# Patient Record
Sex: Male | Born: 1990 | Race: Black or African American | Hispanic: No | Marital: Single | Smoking: Current some day smoker
Health system: Southern US, Community
[De-identification: ages and names within clinical notes are randomized; demographics above are authoritative.]

## PROBLEM LIST (undated history)

## (undated) DIAGNOSIS — R011 Cardiac murmur, unspecified: Secondary | ICD-10-CM

---
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
## 2011-07-21 ENCOUNTER — Inpatient Hospital Stay (INDEPENDENT_AMBULATORY_CARE_PROVIDER_SITE_OTHER)
Admission: RE | Admit: 2011-07-21 | Discharge: 2011-07-21 | Disposition: A | Discharge status: Home / Self Care | Payer: Self-pay | Source: Ambulatory Visit | Attending: Emergency Medicine | Admitting: Emergency Medicine

## 2011-07-21 DIAGNOSIS — S0180XA Unspecified open wound of other part of head, initial encounter: Secondary | ICD-10-CM

## 2016-08-10 ENCOUNTER — Emergency Department (HOSPITAL_COMMUNITY): Payer: No Typology Code available for payment source

## 2016-08-10 ENCOUNTER — Encounter (HOSPITAL_COMMUNITY): Payer: Self-pay | Admitting: Emergency Medicine

## 2016-08-10 ENCOUNTER — Emergency Department (HOSPITAL_COMMUNITY)
Admission: EM | Admit: 2016-08-10 | Discharge: 2016-08-10 | Disposition: A | Discharge status: Home / Self Care | Payer: No Typology Code available for payment source | Attending: Emergency Medicine | Admitting: Emergency Medicine

## 2016-08-10 DIAGNOSIS — S199XXA Unspecified injury of neck, initial encounter: Secondary | ICD-10-CM | POA: Diagnosis present

## 2016-08-10 DIAGNOSIS — S8992XA Unspecified injury of left lower leg, initial encounter: Secondary | ICD-10-CM | POA: Diagnosis not present

## 2016-08-10 DIAGNOSIS — S59902A Unspecified injury of left elbow, initial encounter: Secondary | ICD-10-CM | POA: Diagnosis not present

## 2016-08-10 DIAGNOSIS — S161XXA Strain of muscle, fascia and tendon at neck level, initial encounter: Secondary | ICD-10-CM | POA: Diagnosis not present

## 2016-08-10 DIAGNOSIS — Y939 Activity, unspecified: Secondary | ICD-10-CM | POA: Insufficient documentation

## 2016-08-10 DIAGNOSIS — S0990XA Unspecified injury of head, initial encounter: Secondary | ICD-10-CM | POA: Diagnosis not present

## 2016-08-10 DIAGNOSIS — Y999 Unspecified external cause status: Secondary | ICD-10-CM | POA: Diagnosis not present

## 2016-08-10 DIAGNOSIS — S20211A Contusion of right front wall of thorax, initial encounter: Secondary | ICD-10-CM | POA: Diagnosis not present

## 2016-08-10 DIAGNOSIS — F17200 Nicotine dependence, unspecified, uncomplicated: Secondary | ICD-10-CM | POA: Insufficient documentation

## 2016-08-10 DIAGNOSIS — Y92410 Unspecified street and highway as the place of occurrence of the external cause: Secondary | ICD-10-CM | POA: Diagnosis not present

## 2016-08-10 MED ORDER — IBUPROFEN 800 MG PO TABS: 800.0000 mg | ORAL_TABLET | Freq: Three times a day (TID) | ORAL | 0 refills | Status: DC | PRN

## 2016-08-10 MED ORDER — TRAMADOL HCL 50 MG PO TABS
50.0000 mg | ORAL_TABLET | Freq: Four times a day (QID) | ORAL | 0 refills | Status: DC | PRN
Start: 2016-08-10 — End: 2017-07-10

## 2016-08-10 NOTE — ED Notes (Signed)
Declined W/C at D/C and was escorted to lobby by RN. 
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
## 2016-08-10 NOTE — Discharge Instructions (Signed)
Return here as needed.  Follow-up with a primary care doctor or urgent care

## 2017-07-10 ENCOUNTER — Emergency Department (HOSPITAL_COMMUNITY): Payer: Self-pay

## 2017-07-10 ENCOUNTER — Encounter (HOSPITAL_COMMUNITY): Payer: Self-pay | Admitting: Emergency Medicine

## 2017-07-10 ENCOUNTER — Emergency Department (HOSPITAL_COMMUNITY)
Admission: EM | Admit: 2017-07-10 | Discharge: 2017-07-10 | Disposition: A | Discharge status: Home / Self Care | Payer: Self-pay | Attending: Emergency Medicine | Admitting: Emergency Medicine

## 2017-07-10 DIAGNOSIS — S4992XA Unspecified injury of left shoulder and upper arm, initial encounter: Secondary | ICD-10-CM

## 2017-07-10 DIAGNOSIS — S62314A Displaced fracture of base of fourth metacarpal bone, right hand, initial encounter for closed fracture: Secondary | ICD-10-CM | POA: Insufficient documentation

## 2017-07-10 DIAGNOSIS — Z79899 Other long term (current) drug therapy: Secondary | ICD-10-CM | POA: Insufficient documentation

## 2017-07-10 DIAGNOSIS — Y929 Unspecified place or not applicable: Secondary | ICD-10-CM | POA: Insufficient documentation

## 2017-07-10 DIAGNOSIS — W108XXA Fall (on) (from) other stairs and steps, initial encounter: Secondary | ICD-10-CM | POA: Insufficient documentation

## 2017-07-10 DIAGNOSIS — F17200 Nicotine dependence, unspecified, uncomplicated: Secondary | ICD-10-CM | POA: Insufficient documentation

## 2017-07-10 DIAGNOSIS — S4352XA Sprain of left acromioclavicular joint, initial encounter: Secondary | ICD-10-CM | POA: Insufficient documentation

## 2017-07-10 DIAGNOSIS — Y999 Unspecified external cause status: Secondary | ICD-10-CM | POA: Insufficient documentation

## 2017-07-10 DIAGNOSIS — S62312A Displaced fracture of base of third metacarpal bone, right hand, initial encounter for closed fracture: Secondary | ICD-10-CM | POA: Insufficient documentation

## 2017-07-10 DIAGNOSIS — Y939 Activity, unspecified: Secondary | ICD-10-CM | POA: Insufficient documentation

## 2017-07-10 MED ORDER — HYDROCODONE-ACETAMINOPHEN 5-325 MG PO TABS
1.0000 | ORAL_TABLET | Freq: Once | ORAL | Status: AC
  Administered 2017-07-10: 1 via ORAL
  Filled 2017-07-10: qty 1

## 2017-07-10 MED ORDER — HYDROCODONE-ACETAMINOPHEN 5-325 MG PO TABS: 1.0000 | ORAL_TABLET | Freq: Four times a day (QID) | ORAL | 0 refills | Status: DC | PRN

## 2017-07-10 MED ORDER — NAPROXEN 375 MG PO TABS: ORAL_TABLET | ORAL | 0 refills | Status: DC

## 2017-07-10 NOTE — ED Provider Notes (Addendum)
[facility]-EMERGENCY DEPT Provider Note: Gary Dell, MD, FACEP  CSN: [id number] MRN: [id number] ARRIVAL: 07/10/17 at 0213 ROOM: WA21/[facility]   CHIEF COMPLAINT  Fall   HISTORY OF PRESENT ILLNESS  07/10/17 3:28 AM Gary Owen is a 26 y.o. male who fell down about 11 stairs sometime earlier this morning. He estimates it was about midnight. He is complaining of pain in his right hand with associated swelling dorsally. He rates his pain presently a 5 out of 10, worse with palpation or movement. Range of motion of the right third, fourth and fifth fingers of the right hand is limited due to pain and swelling. There is no sensory or functional deficit distal to the injury. He was given of IV fentanyl by EMS prior to arrival with partial relief. He is also having pain and swelling over his left acromioclavicular joint. He denies neck pain, back pain or other injury.  Consultation with the [facility] state controlled substances database reveals the patient has received one prescription for tramadol in October of last year.   History reviewed. No pertinent past medical history.  History reviewed. No pertinent surgical history.  No family history on file.  Social History  Substance Use Topics  . Smoking status: Current Every Day Smoker  . Smokeless tobacco: Current User  . Alcohol use Yes    Prior to Admission medications   Medication Sig Start Date End Date Taking? Authorizing Provider  ibuprofen (ADVIL,MOTRIN) 800 MG tablet Take 1 tablet (800 mg total) by mouth every 8 (eight) hours as needed. 08/10/16   Lawyer, Cristal Deer, PA-C  traMADol (ULTRAM) 50 MG tablet Take 1 tablet (50 mg total) by mouth every 6 (six) hours as needed for severe pain. 08/10/16   Charlestine Night, PA-C    Allergies Patient has no known allergies.   REVIEW OF SYSTEMS  Negative except as noted here or in the History of Present Illness.   PHYSICAL EXAMINATION  Initial Vital Signs Blood pressure  (!) 149/106, pulse 64, temperature 97.8 F (36.6 C), temperature source Oral, resp. rate 16, height  (1.88 m), weight 93.4 kg (206 lb), SpO2 95 %.  Examination General: Well-developed, well-nourished male in no acute distress; appearance consistent with age of record HENT: normocephalic; atraumatic Eyes: pupils equal, round and reactive to light; extraocular muscles intact Neck: supple; no C-spine tenderness Heart: regular rate and rhythm Lungs: clear to auscultation bilaterally Chest: Nontender Abdomen: soft; nondistended; nontender Back: No spinal tenderness Extremities: Pulses normal; tenderness and swelling of dorsal right hand, fingers distally neurovascularly intact with intact tendon function but range of motion limited due to pain and swelling; tenderness and swelling over left acromioclavicular joint with superficial abrasion, no joint laxity Neurologic: Awake, alert and oriented; motor function intact in all extremities and symmetric; no facial droop Skin: Warm and dry Psychiatric: Normal mood and affect   RESULTS  Summary of this visit's results, reviewed by myself:   EKG Interpretation  Date/Time:    Ventricular Rate:    PR Interval:    QRS Duration:   QT Interval:    QTC Calculation:   R Axis:     Text Interpretation:        Laboratory Studies: No results found for this or any previous visit (from the past 24 hour(s)). Imaging Studies: Dg Shoulder Left  Result Date: 07/10/2017 CLINICAL DATA:  Patient fell down 11 steps. Pain and abrasions to the anterior left shoulder. EXAM: LEFT SHOULDER - 2+ VIEW COMPARISON:  None. FINDINGS:  There is no evidence of fracture or dislocation. There is no evidence of arthropathy or other focal bone abnormality. Soft tissues are unremarkable. IMPRESSION: Negative. Electronically Signed   By: Burman Nieves M.D.   On: 07/10/2017 04:13   Dg Hand Complete Right  Result Date: 07/10/2017 CLINICAL DATA:  Fall EXAM: RIGHT HAND -  COMPLETE 3+ VIEW COMPARISON:  None. FINDINGS: There are medially displaced fractures of the right third and fourth metacarpals near their bases. The articular surfaces are incompletely visualized. There is suspected intra-articular extension of the third metacarpal fracture. I do not see intra-articular extension of the fourth metacarpal fracture. No other fracture of the right hand. IMPRESSION: Medially displaced fractures of the proximal right third and fourth metacarpals. Suspected i intra-articular extension of the third metacarpal fracture. If more precise determination of intra-articular involvement is needed for clinical decision making, CT would be helpful. Electronically Signed   By: Deatra Robinson M.D.   On: 07/10/2017 03:02    ED COURSE  Nursing notes and initial vitals signs, including pulse oximetry, reviewed.  Vitals:   07/10/17 0229 07/10/17 0459  BP: (!) 149/106 (!) 144/86  Pulse: 64 60  Resp: 16 20  Temp: 97.8 F (36.6 C) 97.8 F (36.6 C)  TempSrc: Oral Oral  SpO2: 95% 99%  Weight: 93.4 kg (206 lb)   Height:  (1.88 m)    4:30 AM Discussed with Dr. Melvyn Novas of hand surgery. He recommends we splint the patient and he will see him in the office.  5:18 AM Volar splint applied by orthopedic technician. Fingers remain neurovascularly intact.  PROCEDURES    ED DIAGNOSES     ICD-10-CM   1. Fall down stairs, initial encounter W10.8XXA   2. Closed displaced fracture of base of third metacarpal bone of right hand, initial encounter S62.312A   3. Closed displaced fracture of base of fourth metacarpal bone of right hand, initial encounter S62.314A   4. Acromioclavicular (AC) joint injury, left, initial encounter S49.92XA        [name], [name], MD 07/10/17 9147    [name], MD 07/10/17 Jeralyn Bennett

## 2017-07-10 NOTE — ED Notes (Signed)
Pt. Given ice pack. 

## 2017-07-10 NOTE — ED Notes (Signed)
Pt. Documented in error remove jewelry completed.
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
## 2018-02-10 ENCOUNTER — Inpatient Hospital Stay (HOSPITAL_COMMUNITY)
Admission: EM | Admit: 2018-02-10 | Discharge: 2018-02-11 | DRG: 580 | Disposition: A | Discharge status: Home / Self Care | Payer: Self-pay | Attending: Internal Medicine | Admitting: Internal Medicine

## 2018-02-10 ENCOUNTER — Encounter (HOSPITAL_COMMUNITY): Payer: Self-pay | Admitting: *Deleted

## 2018-02-10 DIAGNOSIS — L0390 Cellulitis, unspecified: Secondary | ICD-10-CM | POA: Diagnosis present

## 2018-02-10 DIAGNOSIS — D696 Thrombocytopenia, unspecified: Secondary | ICD-10-CM | POA: Diagnosis present

## 2018-02-10 DIAGNOSIS — I441 Atrioventricular block, second degree: Secondary | ICD-10-CM | POA: Diagnosis present

## 2018-02-10 DIAGNOSIS — W540XXA Bitten by dog, initial encounter: Secondary | ICD-10-CM

## 2018-02-10 DIAGNOSIS — F17200 Nicotine dependence, unspecified, uncomplicated: Secondary | ICD-10-CM | POA: Diagnosis present

## 2018-02-10 DIAGNOSIS — Z79891 Long term (current) use of opiate analgesic: Secondary | ICD-10-CM

## 2018-02-10 DIAGNOSIS — F17290 Nicotine dependence, other tobacco product, uncomplicated: Secondary | ICD-10-CM

## 2018-02-10 DIAGNOSIS — Z23 Encounter for immunization: Secondary | ICD-10-CM

## 2018-02-10 DIAGNOSIS — S60572A Other superficial bite of hand of left hand, initial encounter: Secondary | ICD-10-CM | POA: Diagnosis present

## 2018-02-10 DIAGNOSIS — Z791 Long term (current) use of non-steroidal anti-inflammatories (NSAID): Secondary | ICD-10-CM

## 2018-02-10 DIAGNOSIS — R001 Bradycardia, unspecified: Secondary | ICD-10-CM | POA: Diagnosis present

## 2018-02-10 DIAGNOSIS — L03114 Cellulitis of left upper limb: Principal | ICD-10-CM | POA: Diagnosis present

## 2018-02-10 DIAGNOSIS — S61452A Open bite of left hand, initial encounter: Secondary | ICD-10-CM

## 2018-02-10 DIAGNOSIS — B9561 Methicillin susceptible Staphylococcus aureus infection as the cause of diseases classified elsewhere: Secondary | ICD-10-CM | POA: Diagnosis present

## 2018-02-10 DIAGNOSIS — L02512 Cutaneous abscess of left hand: Secondary | ICD-10-CM | POA: Diagnosis present

## 2018-02-10 LAB — CBC WITH DIFFERENTIAL/PLATELET
BASOS ABS: 0 10*3/uL (ref 0.0–0.1)
BASOS PCT: 0 %
EOS PCT: 1 %
Eosinophils Absolute: 0.1 10*3/uL (ref 0.0–0.7)
HCT: 42.2 % (ref 39.0–52.0)
Hemoglobin: 14.5 g/dL (ref 13.0–17.0)
LYMPHS PCT: 24 %
Lymphs Abs: 2.4 10*3/uL (ref 0.7–4.0)
MCH: 27.4 pg (ref 26.0–34.0)
MCHC: 34.4 g/dL (ref 30.0–36.0)
MCV: 79.6 fL (ref 78.0–100.0)
Monocytes Absolute: 0.7 10*3/uL (ref 0.1–1.0)
Monocytes Relative: 7 %
Neutro Abs: 6.6 10*3/uL (ref 1.7–7.7)
Neutrophils Relative %: 68 %
PLATELETS: 120 10*3/uL — AB (ref 150–400)
RBC: 5.3 MIL/uL (ref 4.22–5.81)
RDW: 15.1 % (ref 11.5–15.5)
WBC: 9.8 10*3/uL (ref 4.0–10.5)

## 2018-02-10 LAB — BASIC METABOLIC PANEL
ANION GAP: 11 (ref 5–15)
BUN: 9 mg/dL (ref 6–20)
CO2: 23 mmol/L (ref 22–32)
Calcium: 9.7 mg/dL (ref 8.9–10.3)
Chloride: 105 mmol/L (ref 101–111)
Creatinine, Ser: 1.08 mg/dL (ref 0.61–1.24)
GLUCOSE: 101 mg/dL — AB (ref 65–99)
Potassium: 3.9 mmol/L (ref 3.5–5.1)
Sodium: 139 mmol/L (ref 135–145)

## 2018-02-10 LAB — SEDIMENTATION RATE: Sed Rate: 3 mm/hr (ref 0–16)

## 2018-02-10 MED ORDER — HYDROCODONE-ACETAMINOPHEN 5-325 MG PO TABS
1.0000 | ORAL_TABLET | Freq: Once | ORAL | Status: AC
  Administered 2018-02-10: 1 via ORAL
  Filled 2018-02-10: qty 1

## 2018-02-10 MED ORDER — SODIUM CHLORIDE 0.9 % IV SOLN
3.0000 g | Freq: Once | INTRAVENOUS | Status: AC
  Administered 2018-02-10: 3 g via INTRAVENOUS
  Filled 2018-02-10: qty 3

## 2018-02-10 MED ORDER — VITAMIN C 500 MG PO TABS
1000.0000 mg | ORAL_TABLET | Freq: Every day | ORAL | Status: DC
  Administered 2018-02-10 – 2018-02-11 (×2): 1000 mg via ORAL
  Filled 2018-02-10 (×2): qty 2

## 2018-02-10 MED ORDER — TETANUS-DIPHTH-ACELL PERTUSSIS 5-2.5-18.5 LF-MCG/0.5 IM SUSP
0.5000 mL | Freq: Once | INTRAMUSCULAR | Status: AC
  Administered 2018-02-10: 0.5 mL via INTRAMUSCULAR
  Filled 2018-02-10: qty 0.5

## 2018-02-10 MED ORDER — ENOXAPARIN SODIUM 40 MG/0.4ML ~~LOC~~ SOLN
40.0000 mg | SUBCUTANEOUS | Status: DC
  Administered 2018-02-11: 40 mg via SUBCUTANEOUS
  Filled 2018-02-10 (×2): qty 0.4

## 2018-02-10 MED ORDER — SENNOSIDES-DOCUSATE SODIUM 8.6-50 MG PO TABS: 1.0000 | ORAL_TABLET | Freq: Every evening | ORAL | Status: DC | PRN

## 2018-02-10 MED ORDER — FAMOTIDINE 20 MG PO TABS
20.0000 mg | ORAL_TABLET | Freq: Every day | ORAL | Status: DC
  Administered 2018-02-10 – 2018-02-11 (×2): 20 mg via ORAL
  Filled 2018-02-10 (×2): qty 1

## 2018-02-10 MED ORDER — ACETAMINOPHEN 325 MG PO TABS: 650.0000 mg | ORAL_TABLET | Freq: Four times a day (QID) | ORAL | Status: DC | PRN

## 2018-02-10 MED ORDER — METHOCARBAMOL 500 MG PO TABS
500.0000 mg | ORAL_TABLET | Freq: Four times a day (QID) | ORAL | Status: DC | PRN
  Administered 2018-02-11: 500 mg via ORAL
  Filled 2018-02-10: qty 1

## 2018-02-10 MED ORDER — MORPHINE SULFATE (PF) 4 MG/ML IV SOLN: 2.0000 mg | INTRAVENOUS | Status: DC | PRN

## 2018-02-10 MED ORDER — HYDROMORPHONE HCL 2 MG/ML IJ SOLN
0.5000 mg | Freq: Once | INTRAMUSCULAR | Status: AC
  Administered 2018-02-10: 0.5 mg via INTRAVENOUS
  Filled 2018-02-10: qty 1

## 2018-02-10 MED ORDER — SODIUM CHLORIDE 0.9 % IV SOLN
3.0000 g | Freq: Four times a day (QID) | INTRAVENOUS | Status: DC
  Administered 2018-02-10 – 2018-02-11 (×3): 3 g via INTRAVENOUS
  Filled 2018-02-10 (×7): qty 3

## 2018-02-10 MED ORDER — ACETAMINOPHEN 650 MG RE SUPP: 650.0000 mg | Freq: Four times a day (QID) | RECTAL | Status: DC | PRN

## 2018-02-10 MED ORDER — OXYCODONE HCL 5 MG PO TABS
5.0000 mg | ORAL_TABLET | ORAL | Status: DC | PRN
  Administered 2018-02-10 – 2018-02-11 (×2): 5 mg via ORAL
  Filled 2018-02-10 (×2): qty 1

## 2018-02-10 NOTE — ED Triage Notes (Addendum)
Pt in stating he was bit by a dog two weeks ago, in the last two days he has noted swelling to his left thumb and a raised area when there was a puncture wound, redness to thumb noted, dog was known and UTD on vaccinations

## 2018-02-10 NOTE — ED Notes (Signed)
Paged admitting to see if NPO stratus will be changed due to patient not going to OR

## 2018-02-10 NOTE — Progress Notes (Signed)
Pharmacy Antibiotic Note  Silverio LayJarvis K Remus is a 27 y.o. male admitted on 02/10/2018 with wound infection - dog bite.  Pharmacy has been consulted for Unasyn dosing. Received 1x dose of Unasyn at in the ED at 1410. SCr 1.08 on admit.  Plan: Unasyn 3g IV q6h Monitor clinical progress, c/s, renal function F/u de-escalation plan/LOT     Temp (24hrs), Avg:98 F (36.7 C), Min:98 F (36.7 C), Max:98 F (36.7 C)  Recent Labs  Lab 02/10/18 1112  WBC 9.8  CREATININE 1.08    CrCl cannot be calculated (Unknown ideal weight.).    No Known Allergies  Babs BertinHaley Makyle Eslick, PharmD, BCPS Clinical Pharmacist 02/10/2018 3:16 PM

## 2018-02-10 NOTE — ED Notes (Addendum)
Internal medicine at bedside for evaluation. 

## 2018-02-10 NOTE — ED Provider Notes (Signed)
[facility]   CSN: [id number] Arrival date & time: 02/10/18  [id number]     History   Chief Complaint Chief Complaint  Patient presents with  . Hand Pain    HPI Gary Owen is a 27 y.o. male significant past medical history, presenting to the ED with worsening swelling and pain to left hand after a dog bite that occurred 2 weeks ago.  Patient states he got bit on the palm of his hand at the base of his thumb, and washed it initially, however has not had any other treatment for the injury since that time.  He states it has been getting progressively more painful and swollen.  Denies fever or chills.  States the dog was a known dog that is up-to-date on all immunizations including rabies.  The history is provided by the patient.    History reviewed. No pertinent past medical history.  There are no active problems to display for this patient.   History reviewed. No pertinent surgical history.      Home Medications    Prior to Admission medications   Medication Sig Start Date End Date Taking? Authorizing Provider  HYDROcodone-acetaminophen (NORCO) 5-325 MG tablet Take 1 tablet by mouth every 6 (six) hours as needed for severe pain. 07/10/17   Molpus, John, MD  naproxen (NAPROSYN) 375 MG tablet Take one tablet twice daily for pain. 07/10/17   Molpus, Jonny RuizJohn, MD    Family History History reviewed. No pertinent family history.  Social History Social History   Tobacco Use  . Smoking status: Current Every Day Smoker  . Smokeless tobacco: Current User  Substance Use Topics  . Alcohol use: Yes  . Drug use: No     Allergies   Patient has no known allergies.   Review of Systems Review of Systems  Constitutional: Negative for chills and fever.  Musculoskeletal: Positive for myalgias.  Skin: Positive for color change and wound.  Allergic/Immunologic: Negative for immunocompromised state.     Physical Exam Updated  Vital Signs BP (!) 158/101 (BP Location: Right Arm)   Pulse (!) 55   Temp 98 F (36.7 C) (Oral)   Resp 20   SpO2 100%   Physical Exam  Constitutional: He appears well-developed and well-nourished. No distress.  HENT:  Head: Normocephalic and atraumatic.  Eyes: Conjunctivae are normal.  Cardiovascular:  Slightly bradycardic  Pulmonary/Chest: Effort normal.  Musculoskeletal:  Left hand with swelling and erythema on palmar aspect surrounding thumb with 2-3cm fluctuant superficial-appearing fluid collection. Pt able to actively flex all digits. Red streaking noted from palm up anterior arm to [facility].  Psychiatric: He has a normal mood and affect. His behavior is normal.  Nursing note and vitals reviewed.        ED Treatments / Results  Labs (all labs ordered are listed, but only abnormal results are displayed) Labs Reviewed  CULTURE, BLOOD (ROUTINE X 2)  CULTURE, BLOOD (ROUTINE X 2)  CBC WITH DIFFERENTIAL/PLATELET  BASIC METABOLIC PANEL  SEDIMENTATION RATE    EKG None  Radiology No results found.  Procedures Procedures (including critical care time)  Medications Ordered in ED Medications  Ampicillin-Sulbactam (UNASYN) 3 g in sodium chloride 0.9 % 100 mL IVPB (has no administration in time range)  HYDROcodone-acetaminophen (NORCO/VICODIN) 5-325 MG per tablet 1 tablet (1 tablet Oral Given 02/10/18 1114)     Initial Impression / Assessment and Plan / ED Course  I have reviewed the triage vital signs  and the nursing notes.  Pertinent labs & imaging results that were available during my care of the patient were reviewed by me and considered in my medical decision making (see chart for details).  Clinical Course as of Feb 10 1417  Tue Feb 10, 2018  1028 Spoke with Dr. Amanda Pea with hand; plan for IV abx with admission for cellulitis. Dr. Amanda Pea to drain wound. Pt aware and agrees with plan   [JR]  1302 Spoke with Shanda Bumps with Internal Medicine, accepting admission.    [JR]    Clinical Course User Index [JR] Dominique Ressel, [place] N, PA-C    Patient presenting to the ED with dog bite to his left hand that occurred 2 weeks ago, w worsening redness and swelling.  No prior medical treatment prior to arrival.  On exam, left hand with swelling and erythema mostly to palmar aspect with streaking up anterior forearm to the antecubital fossa.  There is a fluctuant fluid-filled mass on the palm of the hand proximal to the thumb, however it appears superficial and may be more of a blister than abscess.  Afebrile without systemic symptoms.  Given significant signs of cellulitis with streaking, patient was discussed with Dr. Phineas Real. Plan for hand consult for appropriate care plan.  Spoke with Dr. Amanda Pea with hand, recommends IV antibiotics and medical admission for cellulitis.  Dr. Amanda Pea to drain fluid collection.  Unasyn ordered.  Labs.  Labs without leukocytosis.  Internal medicine accepting admission for IV antibiotics.  Final Clinical Impressions(s) / ED Diagnoses   Final diagnoses:  Dog bite, initial encounter  Cellulitis of hand, left    ED Discharge Orders    None       Berthel Bagnall, [place] N, PA-C 02/10/18 1424    Phillis Haggis, MD 02/10/18 1504

## 2018-02-10 NOTE — Consult Note (Signed)
Reason for Consult: Left thumb infection/abscess Referring Physician: Hospitalist  Gary Owen Toda is an 27 y.o. male.  HPI: 27 year old male with a abscess in his thenar space.  The patient had a dog bite 2 weeks ago which was fairly unattended to.  At present time he has some erythema pain and worsening findings.  I was asked to see him for evaluation and treatment in the emergency room.  He is here with his significant other.  He is quite pain focused in the thenar space.  I reviewed his findings and other issues.  He has a normal white count at present time.  He denies neck back chest or abdominal pain  History reviewed. No pertinent past medical history.  History reviewed. No pertinent surgical history.  History reviewed. No pertinent family history.  Social History:  reports that he has been smoking.  He uses smokeless tobacco. He reports that he drinks alcohol. He reports that he does not use drugs.  Allergies: No Known Allergies  Medications: I have reviewed the patient's current medications.  Results for orders placed or performed during the hospital encounter of 02/10/18 (from the past 48 hour(s))  CBC with Differential     Status: Abnormal   Collection Time: 02/10/18 11:12 AM  Result Value Ref Range   WBC 9.8 4.0 - 10.5 K/uL   RBC 5.30 4.22 - 5.81 MIL/uL   Hemoglobin 14.5 13.0 - 17.0 g/dL   HCT 42.2 39.0 - 52.0 %   MCV 79.6 78.0 - 100.0 fL   MCH 27.4 26.0 - 34.0 pg   MCHC 34.4 30.0 - 36.0 g/dL   RDW 15.1 11.5 - 15.5 %   Platelets 120 (L) 150 - 400 K/uL   Neutrophils Relative % 68 %   Neutro Abs 6.6 1.7 - 7.7 K/uL   Lymphocytes Relative 24 %   Lymphs Abs 2.4 0.7 - 4.0 K/uL   Monocytes Relative 7 %   Monocytes Absolute 0.7 0.1 - 1.0 K/uL   Eosinophils Relative 1 %   Eosinophils Absolute 0.1 0.0 - 0.7 K/uL   Basophils Relative 0 %   Basophils Absolute 0.0 0.0 - 0.1 K/uL    Comment: Performed at [facility], [address] [place], [place] [address]  Basic  metabolic panel     Status: Abnormal   Collection Time: 02/10/18 11:12 AM  Result Value Ref Range   Sodium 139 135 - 145 mmol/L   Potassium 3.9 3.5 - 5.1 mmol/L   Chloride 105 101 - 111 mmol/L   CO2 23 22 - 32 mmol/L   Glucose, Bld 101 (H) 65 - 99 mg/dL   BUN 9 6 - 20 mg/dL   Creatinine, Ser 1.08 0.61 - 1.24 mg/dL   Calcium 9.7 8.9 - 10.3 mg/dL   GFR calc non Af Amer >60 >60 mL/min   GFR calc Af Amer >60 >60 mL/min    Comment: (NOTE) The eGFR has been calculated using the CKD EPI equation. This calculation has not been validated in all clinical situations. eGFR's persistently <60 mL/min signify possible Chronic Kidney Disease.    Anion gap 11 5 - 15    Comment: Performed at [facility] [address], [place],  [address]  Sedimentation rate     Status: None   Collection Time: 02/10/18 11:22 AM  Result Value Ref Range   Sed Rate 3 0 - 16 mm/hr    Comment: Performed at [facility] [address]  [place],  [address]    No results found.  Review of Systems  Respiratory: Negative.   Cardiovascular: Negative.   Gastrointestinal: Negative.   Genitourinary: Negative.    Blood pressure 111/86, pulse (!) 52, temperature 98 F (36.7 C), temperature source Oral, resp. rate 15, SpO2 99 %. Physical Exam Left thenar region has a large almost blister region with pain and swelling.  His thenar space as well as hyperthenar space are soft.  FPL is intact without infectious flexor tendinitis  No evidence of dystrophy no evidence of compartment syndrome.  He has what appears to be a deep abscess.  The patient is alert and oriented in no acute distress. The patient complains of pain in the affected upper extremity.  The patient is noted to have a normal HEENT exam. Lung fields show equal chest expansion and no shortness of breath. Abdomen exam is nontender without distention. Lower extremity examination does not show any fracture dislocation or blood clot  symptoms. Pelvis is stable and the neck and back are stable and nontender. Assessment/Plan: Abscess thenar region right hand.  I recommend irrigation and debridement.  The patient was consented for this.  Following this we performed a surgical I&D at bedside.  Procedure patient was prepped and draped and underwent surgical I&D of a deep abscess including skin and subtendinous tissue.  I irrigated copiously with saline and packed the wound with a wet-to-dry dressing change.  His thenar space is soft.  His FPL is stable.  I do not see signs of  infectious flexor tenosynovitis.  Following the surgical I&D with curette knife and scissor I recommend admission for IV antibiotics and will consult hydrotherapy for daily whirlpool and begin wet-to-dry dressing changes.  Patient understands this.   I did culture the fluid for aerobic and anaerobic culture.  We will await the culture results.  Once cultures are obtained we can then tailor antibiotics to the infection.  I would suspect a staph given the findings at the time of debridement Willa Frater III 02/10/2018, 9:49 PM

## 2018-02-10 NOTE — ED Notes (Signed)
Resident for internal medicine paged at this time

## 2018-02-10 NOTE — ED Notes (Signed)
This RN spoke with Dr. Amanda PeaGramig at this time.  He is coming to see the patient at this time

## 2018-02-10 NOTE — Discharge Summary (Signed)
Name: Gary Owen MRN: [id number] DOB: 1991-09-01 27 y.o. PCP: System, Provider Not In  Date of Admission: 02/10/2018  9:00 AM Date of Discharge: 02/11/2018 Attending Physician:Narendra, Nischal  Discharge Diagnosis:  Active Problems:   Cellulitis   Discharge Medications: Allergies as of 02/11/2018   No Known Allergies     Medication List    STOP taking these medications   HYDROcodone-acetaminophen 5-325 MG tablet Commonly known as:  NORCO   naproxen 375 MG tablet Commonly known as:  NAPROSYN     TAKE these medications   amoxicillin-clavulanate 875-125 MG tablet Commonly known as:  AUGMENTIN Take 1 tablet by mouth 2 (two) times daily for 5 days.   doxycycline 100 MG tablet Commonly known as:  VIBRA-TABS Take 1 tablet (100 mg total) by mouth 2 (two) times daily for 5 days.     ASK your doctor about these medications   oxyCODONE 5 MG immediate release tablet Commonly known as:  Oxy IR/ROXICODONE Take 1 tablet (5 mg total) by mouth every 6 (six) hours as needed for up to 2 days for severe pain. Ask about: Should I take this medication?       Disposition and follow-up:   Mr.Gary Owen was discharged from [facility] in stable condition.  At the hospital follow up visit please address:  Hand abscess  -ensure wound healing well -ensure pt took full course of abx -culture revealed pan sensitive MSSA  Mobitz type 1 heart block  -chronic issue for pt, pt asymptomatic -NTD   2.  Labs / imaging needed at time of follow-up: cbc  3.  Pending labs/ test needing follow-up: none  Follow-up Appointments: Follow-up Information    Angelita Ingles, MD Follow up in 1 month(s).   Specialty:  Internal Medicine Why:  Please follow up in our clinic it is on the ground floor of [facility] below the emergency department.   Contact information: [address] [place] [address] [phone]        Dominica Severin, MD Follow up in  2 day(s).   Specialty:  Orthopedic Surgery Why:  Please follow up with Dr. Amanda Pea the surgeon at the end of the week Contact information: [address] [place] [address] [phone]           Hospital Course by problem list: Active Problems:   Cellulitis   Pt arrived with an abscess on his left hand that occurred two weeks after a dog bite wound.  The pt was started on IV unasyn, the wound was debrided with good result.  A wound culture was taken, the pt was transitioned to augmentin and doxycycline.  The wound culture returned as MSSA.  He was transitioned to dicloxacillin to finish out his treatment.     Discharge Vitals:   BP (!) 155/75 (BP Location: Right Arm)   Pulse (!) 41   Temp 98.8 F (37.1 C) (Oral)   Resp 18   SpO2 100%   Pertinent Labs, Studies, and Procedures:  CBC Latest Ref Rng & Units 02/11/2018 02/10/2018  WBC 4.0 - 10.5 K/uL 7.1 9.8  Hemoglobin 13.0 - 17.0 g/dL 53.6 64.4  Hematocrit 03.4 - 52.0 % 39.6 42.2  Platelets 150 - 400 K/uL 118(L) 120(L)   BMP Latest Ref Rng & Units 02/11/2018 02/10/2018  Glucose 65 - 99 mg/dL 95 742(V)  BUN 6 - 20 mg/dL 7 9  Creatinine 9.56 - 1.24 mg/dL 3.87 5.64  Sodium 332 -  145 mmol/L 139 139  Potassium 3.5 - 5.1 mmol/L 3.4(L) 3.9  Chloride 101 - 111 mmol/L 104 105  CO2 22 - 32 mmol/L 24 23  Calcium 8.9 - 10.3 mg/dL 9.1 9.7    Media Information         Document Information   Photos    02/10/2018 10:01  Attached To:  Hospital Encounter on 02/10/18   Source Information   Robinson, [place] N, PA-C  [facility]     Media Information         Document Information   Photos    02/10/2018 10:01  Attached To:  Hospital Encounter on 02/10/18   Source Information   Robinson, [place] N, PA-C  [facility]          Status:  Final result   Visible to patient:  No (Not Released) Next appt:  None  Specimen Information: Abscess; Wound      (important suggestion)   Newer results are available. Click to view them now.  Component 5d ago  Specimen Description ABSCESS   Special Requests LEFT HAND 2   Gram Stain MODERATE WBC PRESENT, PREDOMINANTLY PMN  ABUNDANT GRAM POSITIVE COCCI  Performed at [facility], [address] [place], [place] [address]     Culture ABUNDANT STAPHYLOCOCCUS AUREUS   Report Status 02/13/2018 FINAL   Organism ID, Bacteria STAPHYLOCOCCUS AUREUS   Resulting Agency [facility]  Susceptibility    Staphylococcus aureus    MIC    CIPROFLOXACIN <=0.5 SENSI... Sensitive    CLINDAMYCIN <=0.25 SENS... Sensitive    ERYTHROMYCIN <=0.25 SENS... Sensitive    GENTAMICIN <=0.5 SENSI... Sensitive    Inducible Clindamycin NEGATIVE  Sensitive    OXACILLIN <=0.25 SENS... Sensitive    RIFAMPIN <=0.5 SENSI... Sensitive    TETRACYCLINE <=1 SENSITIVE  Sensitive    TRIMETH/SULFA <=10 SENSIT... Sensitive    VANCOMYCIN 1 SENSITIVE  Sensitive         Susceptibility Comments   Staphylococcus aureus  ABUNDANT STAPHYLOCOCCUS AUREUS      Specimen Collected: 02/10/18 19:27       Component 5d ago  Specimen Description BLOOD LEFT ANTECUBITAL   Special Requests BOTTLES DRAWN AEROBIC AND ANAEROBIC Blood Culture adequate volume   Culture NO GROWTH 5 DAYS  Performed at [facility], [address] [place], [place] [address]     Report Status 02/15/2018 FINAL   Resulting Agency [facility]    Specimen Collected: 02/10/18 12:30 Last Resulted: 02/15/18 11:53    Discharge Instructions: Discharge Instructions    Call MD for:  difficulty breathing, headache or visual disturbances   Complete by:  As directed    Call MD for:  hives   Complete by:  As directed    Call MD for:  persistant dizziness or light-headedness   Complete by:  As directed    Call MD for:  persistant nausea and vomiting   Complete by:  As directed    Call MD for:  redness, tenderness, or signs of infection (pain, swelling, redness, odor or green/yellow  discharge around incision site)   Complete by:  As directed    Call MD for:  severe uncontrolled pain   Complete by:  As directed    Call MD for:  temperature >100.4   Complete by:  As directed    Diet - low sodium heart healthy   Complete by:  As directed    Discharge instructions   Complete by:  As  directed    Please take the antibiotics as prescribed and until completion.  Dress and care for the wound as instructed by the physical therapist.  Your surgeon would like you to come see him on Friday afternoon to check on the hand and arm and ensure it is healing well.  We spoke to our cardiologists about your mobitz type 1 heart block and they feel it is not an issue as you have had it your whole life and do not have issues with it.  Your thyroid hormone levels came back normal as well.  You can establish yourself in our clinic for an appointment with me as your primary care doctor.   Increase activity slowly   Complete by:  As directed       Signed: Angelita Ingles, MD 02/15/2018, 8:09 PM

## 2018-02-10 NOTE — H&P (Signed)
Date: 02/10/2018               Patient Name:  Gary Owen MRN: [id number]  DOB: 07/09/1991 Age / Sex: 27 y.o., male   PCP: System, Provider Not In         Medical Service: Internal Medicine Teaching Service         Attending Physician: Dr. Earl Lagos, MD    First Contact: Dr. Frances Furbish Pager: [phone]  Second Contact: Dr. Mikey Bussing Pager: [phone]       After Hours (After 5p/  First Contact Pager: [phone]  weekends / holidays): Second Contact Pager: [phone]   Chief Complaint: left hand pain  History of Present Illness: This is a 27 year old male with no prior medical history.  He is here for management of a dog bite that got infected.  He was playing with his girlfriend's dog about 2 weeks ago, she is debrided of miniature poodle who is up to date on all immunizations.  The dog bit him on the hand, he said he initially went to clean it with some peroxide but said he did not do a very good job he was late for work and never bandaged the wound.  He said it seemed fine until 2 days ago when he noticed it started hurting and noticed some mild swelling.  This morning he notes that it was very swollen and very painful and noticed a red streak going up his arm and felt he should get checked out.  He denies any fevers nausea/vomiting or diarrhea.  He did say he felt like he had some chills last night.  When asked about his slow heart rate, he says that his heart rate has been very slow as long as he can remember.  He said he had a murmur as a child that resolved other than that knows of no cardiac history.  He has never had a STI in the past, he is sexually active but says he uses condoms.  He does not recall his last tetanus shot but feels like he is due.    ED course: pt arrived with normal vitals other than mild bradycardia, normal white count, mild thrombocytopenia 120k.  Normal Cr.  Blood cultures were drawn, he was started on IV unasyn, hand surgery was consulted.    Meds:  No  outpatient medications have been marked as taking for the 02/10/18 encounter [facility] Encounter).     Allergies: Allergies as of 02/10/2018  . (No Known Allergies)   History reviewed. No pertinent past medical history.  Family History: History reviewed. No pertinent family history.  Social History:  Social History   Socioeconomic History  . Marital status: Single    Spouse name: Not on file  . Number of children: Not on file  . Years of education: Not on file  . Highest education level: Not on file  Occupational History  . Not on file  Social Needs  . Financial resource strain: Not on file  . Food insecurity:    Worry: Not on file    Inability: Not on file  . Transportation needs:    Medical: Not on file    Non-medical: Not on file  Tobacco Use  . Smoking status: Current Every Day Smoker  . Smokeless tobacco: Current User  Substance and Sexual Activity  . Alcohol use: Yes  . Drug use: No  . Sexual activity: Not on file  Lifestyle  . Physical activity:    Days  per week: Not on file    Minutes per session: Not on file  . Stress: Not on file  Relationships  . Social connections:    Talks on phone: Not on file    Gets together: Not on file    Attends religious service: Not on file    Active member of club or organization: Not on file    Attends meetings of clubs or organizations: Not on file    Relationship status: Not on file  . Intimate partner violence:    Fear of current or ex partner: Not on file    Emotionally abused: Not on file    Physically abused: Not on file    Forced sexual activity: Not on file  Other Topics Concern  . Not on file  Social History Narrative  . Not on file  smokes 2 black and milds per day Drinks one beer per night  Review of Systems: A complete ROS was negative except as per HPI.   Physical Exam: Blood pressure (!) 149/102, pulse (!) 56, temperature 98 F (36.7 C), temperature source Oral, resp. rate 18, SpO2 100  %. Physical Exam  Constitutional: He is oriented to person, place, and time. He appears well-developed and well-nourished.  HENT:  Head: Normocephalic and atraumatic.  Eyes: Right eye exhibits no discharge. Left eye exhibits no discharge. No scleral icterus.  Neck: No JVD present.  Cardiovascular: Regular rhythm, normal heart sounds and intact distal pulses. Bradycardia present. Exam reveals no gallop and no friction rub.  No murmur heard. Pulmonary/Chest: Effort normal and breath sounds normal. No respiratory distress. He has no wheezes. He has no rales.  Abdominal: Soft. Bowel sounds are normal. He exhibits no distension and no mass. There is no tenderness. There is no guarding.  Musculoskeletal: He exhibits no edema.  Neurological: He is alert and oriented to person, place, and time.  Skin:     Raised abscess of thenar surface of left hand inferior to thumb.  There is erythema tracking up the forearm he is tender inferior to the wrist and distally to the abscess.    Psychiatric: He has a normal mood and affect.    Media Information         Document Information   Photos    02/10/2018 10:01  Attached To:  Hospital Encounter on 02/10/18   Source Information   Robinson, [place] N, PA-C  [facility]    Media Information         Document Information   Photos    02/10/2018 10:01  Attached To:  Hospital Encounter on 02/10/18   Source Information   Robinson, [place] N, PA-C  [facility]    EKG: personally reviewed my interpretation is none available  CXR: personally reviewed my interpretation is none available  Assessment & Plan by Problem: Active Problems:   Cellulitis  Dob bite w/ resulting L hand abscess: bite was two weeks ago, site grossly infected with abscess, infection beginning to track up the arm.    -Continue Unasyn IV -Oxy IR 5mg  Q4 PRN -Hand surgeon consulted for I and D  Bradycardia: pt is an athletic young male, does not  seem to bother him  -will get a baseline ecg during admission  -continue to monitor   Dispo: Admit patient to Inpatient with expected length of stay greater than 2 midnights.  Signed: Angelita InglesWinfrey, Jacole Capley B, MD 02/10/2018, 3:25 PM  Thornell MuleBrandon Mohogany Toppins MD PGY-1 Internal Medicine Pager # [phone]

## 2018-02-11 DIAGNOSIS — I441 Atrioventricular block, second degree: Secondary | ICD-10-CM

## 2018-02-11 DIAGNOSIS — L02512 Cutaneous abscess of left hand: Secondary | ICD-10-CM

## 2018-02-11 DIAGNOSIS — B9561 Methicillin susceptible Staphylococcus aureus infection as the cause of diseases classified elsewhere: Secondary | ICD-10-CM

## 2018-02-11 LAB — BASIC METABOLIC PANEL
Anion gap: 11 (ref 5–15)
BUN: 7 mg/dL (ref 6–20)
CO2: 24 mmol/L (ref 22–32)
Calcium: 9.1 mg/dL (ref 8.9–10.3)
Chloride: 104 mmol/L (ref 101–111)
Creatinine, Ser: 1.05 mg/dL (ref 0.61–1.24)
GFR calc Af Amer: >60 mL/min (ref >60)
GLUCOSE: 95 mg/dL (ref 65–99)
POTASSIUM: 3.4 mmol/L — AB (ref 3.5–5.1)
Sodium: 139 mmol/L (ref 135–145)

## 2018-02-11 LAB — MAGNESIUM: Magnesium: 1.7 mg/dL (ref 1.7–2.4)

## 2018-02-11 LAB — CBC
HEMATOCRIT: 39.6 % (ref 39.0–52.0)
Hemoglobin: 13.5 g/dL (ref 13.0–17.0)
MCH: 27.2 pg (ref 26.0–34.0)
MCHC: 34.1 g/dL (ref 30.0–36.0)
MCV: 79.7 fL (ref 78.0–100.0)
Platelets: 118 10*3/uL — ABNORMAL LOW (ref 150–400)
RBC: 4.97 MIL/uL (ref 4.22–5.81)
RDW: 15.6 % — AB (ref 11.5–15.5)
WBC: 7.1 10*3/uL (ref 4.0–10.5)

## 2018-02-11 LAB — PHOSPHORUS: Phosphorus: 3.7 mg/dL (ref 2.5–4.6)

## 2018-02-11 LAB — HIV ANTIBODY (ROUTINE TESTING W REFLEX): HIV SCREEN 4TH GENERATION: NONREACTIVE

## 2018-02-11 LAB — TSH: TSH: 1.662 u[IU]/mL (ref 0.350–4.500)

## 2018-02-11 MED ORDER — SODIUM CHLORIDE 0.9 % IV SOLN
100.0000 mg | Freq: Two times a day (BID) | INTRAVENOUS | Status: DC
  Administered 2018-02-11: 100 mg via INTRAVENOUS
  Filled 2018-02-11 (×2): qty 100

## 2018-02-11 MED ORDER — WHITE PETROLATUM EX OINT
TOPICAL_OINTMENT | CUTANEOUS | Status: AC
  Filled 2018-02-11: qty 28.35

## 2018-02-11 MED ORDER — AMOXICILLIN-POT CLAVULANATE 875-125 MG PO TABS: 1.0000 | ORAL_TABLET | Freq: Two times a day (BID) | ORAL | 0 refills | Status: AC

## 2018-02-11 MED ORDER — OXYCODONE HCL 5 MG PO TABS: 5.0000 mg | ORAL_TABLET | Freq: Four times a day (QID) | ORAL | 0 refills | Status: AC | PRN

## 2018-02-11 MED ORDER — DOXYCYCLINE HYCLATE 100 MG PO TABS: 100.0000 mg | ORAL_TABLET | Freq: Two times a day (BID) | ORAL | 0 refills | Status: AC

## 2018-02-11 NOTE — Progress Notes (Signed)
Physical Therapy Wound Evaluation and Treatment Patient Details  Name: Gary Owen MRN: [id number] Date of Birth: 02/15/1991  Today's Date: 02/11/2018 Time: 1126-1206 Time Calculation (min): 40 min  Subjective  Subjective: "it feels so much better" Patient and Family Stated Goals: Heal wound Date of Onset: 01/23/18 Prior Treatments: I&D 02/10/18  Pain Score:  Pt was premedicated however reports significant pain during treatment.  Wound Assessment  Wound / Incision (Open or Dehisced) 02/11/18 Incision - Open Hand Left;Other (Comment) Palmar surface at thenar eminence (Active)  Wound Image   02/11/2018 11:37 AM  Dressing Type Compression wrap;Gauze (Comment);Moist to dry 02/11/2018 11:37 AM  Dressing Changed Changed 02/11/2018 11:37 AM  Dressing Status Clean;Dry;Intact 02/11/2018 11:37 AM  Dressing Change Frequency Daily 02/11/2018 11:37 AM  Site / Wound Assessment Clean;Red 02/11/2018 11:37 AM  % Wound base Red or Granulating 100% 02/11/2018 11:37 AM  % Wound base Yellow/Fibrinous Exudate 0% 02/11/2018 11:37 AM  % Wound base Black/Eschar 0% 02/11/2018 11:37 AM  % Wound base Other/Granulation Tissue (Comment) 0% 02/11/2018 11:37 AM  Peri-wound Assessment Intact 02/11/2018 11:37 AM  Wound Length (cm) 3 cm 02/11/2018 11:37 AM  Wound Width (cm) 2.5 cm 02/11/2018 11:37 AM  Wound Depth (cm) 0.1 cm 02/11/2018 11:37 AM  Wound Volume (cm^3) 0.75 cm^3 02/11/2018 11:37 AM  Wound Surface Area (cm^2) 7.5 cm^2 02/11/2018 11:37 AM  Tunneling (cm) 0 02/11/2018 11:37 AM  Undermining (cm) 0 02/11/2018 11:37 AM  Margins Unattached edges (unapproximated) 02/11/2018 11:37 AM  Closure None 02/11/2018 11:37 AM  Drainage Amount Scant 02/11/2018 11:37 AM  Drainage Description Sanguineous 02/11/2018 11:37 AM  Non-staged Wound Description Not applicable 9/87/2158 72:76 AM  Treatment Hydrotherapy (Pulse lavage);Packing (Saline gauze) 02/11/2018 11:37 AM      Hydrotherapy Pulsed lavage therapy - wound location: L  hand Pulsed Lavage with Suction (psi): 8 psi Pulsed Lavage with Suction - Normal Saline Used: 1000 mL Pulsed Lavage Tip: Tip with splash shield   Wound Assessment and Plan  Wound Therapy - Assess/Plan/Recommendations Wound Therapy - Clinical Statement: Pt presents to hydrotherapy s/p I&D. Pt was educated on wet>dry dressings and ROM exercise. Will continue to follow until d/c.  Wound Therapy - Functional Problem List: Acute pain, decreased strength and AROM  Hydrotherapy Plan: Dressing change;Patient/family education;Pulsatile lavage with suction Wound Therapy - Frequency: 6X / week Wound Plan: See above  Wound Therapy Goals- Improve the function of patient's integumentary system by progressing the wound(s) through the phases of wound healing (inflammation - proliferation - remodeling) by: Patient/Family will be able to : perform dressing changes independently Patient/Family Instruction Goal - Progress: Goal set today Goals/treatment plan/discharge plan were made with and agreed upon by patient/family: Yes Time For Goal Achievement: 7 days Wound Therapy - Potential for Goals: Excellent  Goals will be updated until maximal potential achieved or discharge criteria met.  Discharge criteria: when goals achieved, discharge from hospital, MD decision/surgical intervention, no progress towards goals, refusal/missing three consecutive treatments without notification or medical reason.  GP     Thelma Comp 02/11/2018, 12:59 PM   Rolinda Roan, PT, DPT Acute Rehabilitation Services Pager: [phone]

## 2018-02-11 NOTE — Progress Notes (Signed)
IMTS Cross Cover Progress Note  Evaluated patient at bedside for bradycardia; he has had drops to high 20s w/o reported symptoms per nursing. Patient states that he has been told he has a slow heart rate previously and thinks he's had a testing done as a teenager but does not remember. He currently denies dizziness, chest pain, shortness of breath.  Physical Exam: Constitutional: NAD, lying in bed comfortably CV: bradycardic with dropped beats, N S1/S2, no murmurs Resp: No increased work of breathing, CTAB on anterior lung fields Neuro: intact  BP stable with SBP in 140s.   EKG personally reviewed from admission - Bradycardia with prolonged PR interval with Mobitz type 2 block EKG personally reviewed tonight - Bradycardia with prolonged PR interval with 2 sets of what appears to be Mobitz type 1 block Tele personally reviewed - consistent PR intervals at random samplings  Assessment & Plan: Patient asymptomatic and hemodynamically stable. Discussed with cards fellow who agrees no further action needed unless he becomes hemodynamically unstable, at which time can try atropine prior to pacing, however stated that if Mobitz type 2, it may not be responsive to atropine. Appreciate their input. Will continue to monitor patient on tele.  Nyra MarketGorica Dawnell [facility], MD IMTS - PGY 2 Pager [phone]

## 2018-02-11 NOTE — Progress Notes (Addendum)
   Subjective: Pt reports feeling well today, very glad his hand has been taken care of.  Denies any chest pain, dizziness or SOB.  Hand pain is minimal.  Objective:  Vital signs in last 24 hours: Vitals:   02/10/18 2315 02/11/18 0045 02/11/18 0100 02/11/18 0225  BP:  (!) 145/73 134/69 126/71  Pulse: (!) 48 70 (!) 55 (!) 53  Resp: 16 18 14 15   Temp:    98.1 F (36.7 C)  TempSrc:    Oral  SpO2:  100% 100% 100%   Physical Exam  Constitutional: He appears well-developed and well-nourished.  Eyes: Right eye exhibits no discharge. Left eye exhibits no discharge. No scleral icterus.  Cardiovascular: Normal heart sounds and intact distal pulses. Bradycardia present. Exam reveals no gallop and no friction rub.  No murmur heard. Occasional pauses appreciated  Pulmonary/Chest: Effort normal and breath sounds normal. No respiratory distress. He has no wheezes. He has no rales.  Abdominal: Soft. Bowel sounds are normal. He exhibits no distension and no mass. There is no tenderness. There is no guarding.  Neurological: He is alert.  Skin:  Hand wrapped tightly after incision and drainage    Assessment/Plan:  Active Problems:   Cellulitis  Dob bite w/ resulting L hand abscess: bite was two weeks ago, site grossly infected with abscess, infection beginning to track up the arm.  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  There is concern that not all episodes of dropped beats appear after progressive prolongation of PR interval and may have episodes more consistent with type 2.  pt is an athletic young male, plays basketball, does not seem to bother him but does notice the slow rate.    -TSH ordered returned normal, K low normal checking mag and phos  -continue cardiac monitoring -cardiology was  consulted   Dispo: Anticipated discharge in approximately 1 day(s).   Angelita InglesWinfrey, Deshannon Seide B, MD 02/11/2018, 9:34 AM Thornell MuleBrandon Stephine Langbehn MD PGY-1 Internal Medicine Pager # [phone]

## 2018-02-11 NOTE — Consult Note (Signed)
Patient is seen and examined at bedside.  He states he feels 100% better.  He has minimal erythema and cellulitis.  I would recommend he continue elevation and range of motion.  He states he is anxious to go home.  I should note that I do feel that a PT hydrotherapy visit should be performed today.  They should teach him wet-to-dry dressing changes to the area.  If he can demonstrate competency with wet-to-dry dressing changes then I do feel it is safe to discharge him today with follow-up in my office Friday afternoon for a check.  I would recommend discharge on a good antibiotic to cover staph aureus such as doxycycline or Bactrim.  The infection I debrided appeared to have this looked.  I do not feel this is Pasteurella.  I feel this is more of a staph or strep.  Thus my recommendations for a good staph coverage.  Augmentin typically does not cover staph so well.  He understands the plans.  I will be happy to see him Friday afternoon in my office.  Once again if he demonstrates competency with the wet-to-dry dressing changes he can go home today after the hospitalist season improves.  I will be happy to see him Friday.  I will check on his cultures then but once again would recommend staph coverage and that he change the bandages daily and be supplied dressing changes  Keosha Rossa MD cell phone [phone]

## 2018-02-11 NOTE — ED Notes (Signed)
Called admitting for low HR of 29, pt alert and able to speak, HR right back to 57, instructed to get EKG, pt states this is normal for him. Pt does not know what heart condition he has but said it has been going on for his whole life

## 2018-02-13 ENCOUNTER — Telehealth: Payer: Self-pay | Admitting: Internal Medicine

## 2018-02-13 LAB — AEROBIC CULTURE W GRAM STAIN (SUPERFICIAL SPECIMEN)

## 2018-02-13 LAB — AEROBIC CULTURE  (SUPERFICIAL SPECIMEN)

## 2018-02-13 MED ORDER — DICLOXACILLIN SODIUM 500 MG PO CAPS: 500.0000 mg | ORAL_CAPSULE | Freq: Four times a day (QID) | ORAL | 0 refills | Status: AC

## 2018-02-13 NOTE — Telephone Encounter (Signed)
Left message about narrowing pts antibiotics to dicloxacillin

## 2018-02-15 LAB — CULTURE, BLOOD (ROUTINE X 2)
Culture: NO GROWTH
Culture: NO GROWTH
SPECIAL REQUESTS: ADEQUATE
SPECIAL REQUESTS: ADEQUATE

## 2019-06-09 ENCOUNTER — Other Ambulatory Visit: Payer: Self-pay

## 2019-06-09 ENCOUNTER — Ambulatory Visit (HOSPITAL_COMMUNITY)
Admission: EM | Admit: 2019-06-09 | Discharge: 2019-06-09 | Disposition: A | Discharge status: Home / Self Care | Payer: Self-pay | Attending: Urgent Care | Admitting: Urgent Care

## 2019-06-09 ENCOUNTER — Encounter (HOSPITAL_COMMUNITY): Payer: Self-pay | Admitting: Urgent Care

## 2019-06-09 DIAGNOSIS — N451 Epididymitis: Secondary | ICD-10-CM

## 2019-06-09 DIAGNOSIS — N453 Epididymo-orchitis: Secondary | ICD-10-CM | POA: Insufficient documentation

## 2019-06-09 DIAGNOSIS — N5089 Other specified disorders of the male genital organs: Secondary | ICD-10-CM

## 2019-06-09 DIAGNOSIS — N50812 Left testicular pain: Secondary | ICD-10-CM

## 2019-06-09 DIAGNOSIS — N452 Orchitis: Secondary | ICD-10-CM | POA: Insufficient documentation

## 2019-06-09 MED ORDER — CEFTRIAXONE SODIUM 1 G IJ SOLR
1.0000 g | Freq: Once | INTRAMUSCULAR | Status: AC
  Administered 2019-06-09: 1 g via INTRAMUSCULAR

## 2019-06-09 MED ORDER — CEFTRIAXONE SODIUM 1 G IJ SOLR
INTRAMUSCULAR | Status: AC
  Filled 2019-06-09: qty 10

## 2019-06-09 MED ORDER — NAPROXEN 500 MG PO TABS: 500.0000 mg | ORAL_TABLET | Freq: Two times a day (BID) | ORAL | 0 refills | Status: DC

## 2019-06-09 MED ORDER — DOXYCYCLINE HYCLATE 100 MG PO CAPS: 100.0000 mg | ORAL_CAPSULE | Freq: Two times a day (BID) | ORAL | 0 refills | Status: DC

## 2019-06-09 NOTE — ED Provider Notes (Addendum)
  MRN: [id number] DOB: 01-13-1991  Subjective:   Gary Owen is a 28 y.o. male presenting for several day history of moderate to severe left-sided testicular pain and swelling.  Patient reports that swelling is significantly better today but still has exquisite pain.  Of note, patient reports that his girlfriend had surgery for PID about 2 weeks ago.  He has not gotten any kind of testing or treatment since then. He is not currently taking any medications and has no known food or drug allergies.  Denies past medical and surgical history.   ROS Denies dysuria, hematuria, urinary frequency, penile discharge, penile swelling, anal pain, groin pain, n/v, abdominal pain.   Objective:   Vitals: BP 109/65 (BP Location: Left Arm)   Pulse 64   Temp 97.8 F (36.6 C) (Oral)   Resp 16   SpO2 97%   Physical Exam Constitutional:      Appearance: Normal appearance. He is well-developed and normal weight.  HENT:     Head: Normocephalic and atraumatic.     Right Ear: External ear normal.     Left Ear: External ear normal.     Nose: Nose normal.     Mouth/Throat:     Pharynx: Oropharynx is clear.  Eyes:     Extraocular Movements: Extraocular movements intact.     Pupils: Pupils are equal, round, and reactive to light.  Cardiovascular:     Rate and Rhythm: Normal rate.  Pulmonary:     Effort: Pulmonary effort is normal.  Genitourinary:    Penis: Circumcised.      Scrotum/Testes: Cremasteric reflex is present.     Epididymis:     Left: Enlarged. Tenderness present.      Neurological:     Mental Status: He is alert and oriented to person, place, and time.  Psychiatric:        Mood and Affect: Mood normal.        Behavior: Behavior normal.     Assessment and Plan :   1. Orchitis, epididymitis, and epididymo-orchitis   2. Testicular pain, left   3. Testicular swelling, left     Labs pending, will cover as per up-to-date for epididymitis/orchitis with ceftriaxone and  doxycycline.  Will dictate any further treatment based off of lab results.  Patient is to use naproxen for pain and inflammation.  Strict ER and return to clinic precautions. Counseled patient on potential for adverse effects with medications prescribed today, patient verbalized understanding.      Jaynee Eagles, PA-C 06/09/19 1028

## 2019-06-09 NOTE — ED Triage Notes (Signed)
Testicular pain and swollen that started 4 days ago.  Reports swelling less today than yesterday.  Denies any injury

## 2019-06-10 LAB — URINE CYTOLOGY ANCILLARY ONLY
Chlamydia: NEGATIVE
Neisseria Gonorrhea: NEGATIVE
Trichomonas: NEGATIVE

## 2019-06-10 LAB — URINE CULTURE: Culture: NO GROWTH

## 2019-07-05 ENCOUNTER — Other Ambulatory Visit: Payer: Self-pay

## 2019-07-05 ENCOUNTER — Encounter (HOSPITAL_COMMUNITY): Payer: Self-pay | Admitting: Emergency Medicine

## 2019-07-05 ENCOUNTER — Ambulatory Visit (HOSPITAL_COMMUNITY)
Admission: EM | Admit: 2019-07-05 | Discharge: 2019-07-05 | Disposition: A | Discharge status: Home / Self Care | Payer: Self-pay | Attending: Family Medicine | Admitting: Family Medicine

## 2019-07-05 DIAGNOSIS — M5441 Lumbago with sciatica, right side: Secondary | ICD-10-CM

## 2019-07-05 DIAGNOSIS — G8929 Other chronic pain: Secondary | ICD-10-CM

## 2019-07-05 DIAGNOSIS — G5701 Lesion of sciatic nerve, right lower limb: Secondary | ICD-10-CM

## 2019-07-05 MED ORDER — METHYLPREDNISOLONE 4 MG PO TBPK: ORAL_TABLET | ORAL | 0 refills | Status: DC

## 2019-07-05 MED ORDER — TIZANIDINE HCL 4 MG PO TABS: 4.0000 mg | ORAL_TABLET | Freq: Four times a day (QID) | ORAL | 0 refills | Status: DC | PRN

## 2019-07-05 MED ORDER — IBUPROFEN 800 MG PO TABS: 800.0000 mg | ORAL_TABLET | Freq: Three times a day (TID) | ORAL | 0 refills | Status: DC

## 2019-07-05 NOTE — Discharge Instructions (Signed)
Activity as tolerated.  Avoid bedrest Take the Medrol Dosepak as directed.  This is a steroid anti-inflammatory medicine. Take ibuprofen 3 times a day with food.  This is for pain Take tizanidine as needed as a muscle relaxer.  This is useful at bedtime Use ice or heat to area Continue stretching at least twice a day I recommend following up with a spine specialist

## 2019-07-05 NOTE — ED Provider Notes (Signed)
[facility]    CSN: [id number] Arrival date & time: 07/05/19  1457      History   Chief Complaint Chief Complaint  Patient presents with  . Back Pain    HPI Gary Owen is a 28 y.o. male.   HPI  Patient states he said back pain that radiates into his right buttock and leg for over 3 years.  He states he has back pain on a daily basis.  Some days worse than others.  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  On some days he has difficulty doing this job.  He states that he does take a lot of over-the-counter medicine to get through his workday.  He is here today because he was unable to work secondary to his pain. He states that he did get medical care for this once about 3 years ago.  He was told it was piriformis syndrome.  He did see physical therapy.  They gave him a number of stretches and exercises to do.  He does them when he remembers but not on a regular basis.  He never had any trauma or injury. He has never had any back x-rays or imaging.  He is never seen an orthopedic or back specialist.  I advised him that any person who has had back pain for 3 years really should see a specialist for additional diagnosis, treatment, and recommendations  History reviewed. No pertinent past medical history.  Patient Active Problem List   Diagnosis Date Noted  . Cellulitis 02/10/2018    History reviewed. No pertinent surgical history.     Home Medications    Prior to Admission medications   Medication Sig Start Date End Date Taking? Authorizing Provider  ibuprofen (ADVIL) 800 MG tablet Take 1 tablet (800 mg total) by mouth 3 (three) times daily. 07/05/19   Eustace Moore, MD  methylPREDNISolone (MEDROL DOSEPAK) 4 MG TBPK tablet tad 07/05/19   Eustace Moore, MD  tiZANidine (ZANAFLEX) 4 MG tablet Take 1-2 tablets (4-8 mg total) by mouth every 6 (six) hours as needed for muscle spasms. 07/05/19   Eustace Moore, MD    Family History Family  History  Problem Relation Age of Onset  . Healthy Mother   . Healthy Father     Social History Social History   Tobacco Use  . Smoking status: Former Games developer  . Smokeless tobacco: Current User  Substance Use Topics  . Alcohol use: Yes  . Drug use: Yes    Types: Marijuana     Allergies   Patient has no known allergies.   Review of Systems Review of Systems  Constitutional: Negative for chills and fever.  HENT: Negative for ear pain and sore throat.   Eyes: Negative for pain and visual disturbance.  Respiratory: Negative for cough and shortness of breath.   Cardiovascular: Negative for chest pain and palpitations.  Gastrointestinal: Negative for abdominal pain and vomiting.  Genitourinary: Negative for dysuria and hematuria.  Musculoskeletal: Positive for back pain. Negative for arthralgias.  Skin: Negative for color change and rash.  Neurological: Negative for seizures and syncope.  All other systems reviewed and are negative.    Physical Exam Triage Vital Signs ED Triage Vitals  Enc Vitals Group     BP 07/05/19 1536 137/70     Pulse Rate 07/05/19 1536 63     Resp 07/05/19 1536 18     Temp 07/05/19 1536 98.4 F (36.9  C)     Temp Source 07/05/19 1536 Oral     SpO2 07/05/19 1536 100 %     Weight --      Height --      Head Circumference --      Peak Flow --      Pain Score 07/05/19 1537 8     Pain Loc --      Pain Edu? --      Excl. in [place]? --    No data found.  Updated Vital Signs BP 137/70 (BP Location: Right Arm)   Pulse 63   Temp 98.4 F (36.9 C) (Oral)   Resp 18   SpO2 100%   Visual Acuity Right Eye Distance:   Left Eye Distance:   Bilateral Distance:    Right Eye Near:   Left Eye Near:    Bilateral Near:     Physical Exam Constitutional:      General: He is not in acute distress.    Appearance: He is well-developed.  HENT:     Head: Normocephalic and atraumatic.  Eyes:     Conjunctiva/sclera: Conjunctivae normal.     Pupils:  Pupils are equal, round, and reactive to light.  Neck:     Musculoskeletal: Normal range of motion.  Cardiovascular:     Rate and Rhythm: Normal rate.  Pulmonary:     Effort: Pulmonary effort is normal. No respiratory distress.  Abdominal:     General: There is no distension.     Palpations: Abdomen is soft.  Musculoskeletal: Normal range of motion.     Comments: Patient has very stiff and guarded movements.  He can forward flex only to fingertips to his knees.  He has normal lateral and rotary movement.  He can stand on his heels and toes.  He can do a partial deep knee bend with no difficulty.  Strength sensation range of motion reflexes are intact in both lower extremities.  He does has a negative straight leg raise bilaterally.  There is no focal neurologic deficits.  He does have tenderness to palpation over the right SI area and deep in the right buttock.  Full range of motion of hips  Skin:    General: Skin is warm and dry.  Neurological:     General: No focal deficit present.     Mental Status: He is alert.     Gait: Gait normal.     Deep Tendon Reflexes: Reflexes normal.  Psychiatric:        Mood and Affect: Mood normal.      UC Treatments / Results  Labs (all labs ordered are listed, but only abnormal results are displayed) Labs Reviewed - No data to display  EKG   Radiology No results found.  Procedures Procedures (including critical care time)  Medications Ordered in UC Medications - No data to display  Initial Impression / Assessment and Plan / UC Course  I have reviewed the triage vital signs and the nursing notes.  Pertinent labs & imaging results that were available during my care of the patient were reviewed by me and considered in my medical decision making (see chart for details).     Discussed with patient that I can treat him for his flare of pain, but his longstanding condition needs ongoing management.  I gave him a referral for spine specialty  Final Clinical Impressions(s) / UC Diagnoses   Final diagnoses:  Piriformis syndrome of right side  Chronic right-sided low back  pain with right-sided sciatica     Discharge Instructions     Activity as tolerated.  Avoid bedrest Take the Medrol Dosepak as directed.  This is a steroid anti-inflammatory medicine. Take ibuprofen 3 times a day with food.  This is for pain Take tizanidine as needed as a muscle relaxer.  This is useful at bedtime Use ice or heat to area Continue stretching at least twice a day I recommend following up with a spine specialist   ED Prescriptions    Medication Sig Dispense Auth. Provider   methylPREDNISolone (MEDROL DOSEPAK) 4 MG TBPK tablet tad 21 tablet Eustace MooreNelson, Shunte Senseney Sue, MD   ibuprofen (ADVIL) 800 MG tablet Take 1 tablet (800 mg total) by mouth 3 (three) times daily. 21 tablet Eustace MooreNelson, Mandel Seiden Sue, MD   tiZANidine (ZANAFLEX) 4 MG tablet Take 1-2 tablets (4-8 mg total) by mouth every 6 (six) hours as needed for muscle spasms. 21 tablet Eustace MooreNelson, Anntoinette Haefele Sue, MD     Controlled Substance Prescriptions [place] Controlled Substance Registry consulted? Not Applicable   Eustace MooreNelson, Louretta Tantillo Sue, MD 07/05/19 1910

## 2019-07-05 NOTE — ED Triage Notes (Signed)
Pt here for lower back pain with radiation to right leg x 3 years

## 2020-12-04 ENCOUNTER — Ambulatory Visit (HOSPITAL_COMMUNITY)
Admission: EM | Admit: 2020-12-04 | Discharge: 2020-12-04 | Disposition: A | Discharge status: Home / Self Care | Payer: Self-pay | Attending: Urgent Care | Admitting: Urgent Care

## 2020-12-04 ENCOUNTER — Other Ambulatory Visit: Payer: Self-pay

## 2020-12-04 ENCOUNTER — Ambulatory Visit (INDEPENDENT_AMBULATORY_CARE_PROVIDER_SITE_OTHER): Payer: Self-pay

## 2020-12-04 ENCOUNTER — Encounter (HOSPITAL_COMMUNITY): Payer: Self-pay | Admitting: *Deleted

## 2020-12-04 DIAGNOSIS — M79641 Pain in right hand: Secondary | ICD-10-CM

## 2020-12-04 DIAGNOSIS — S6991XA Unspecified injury of right wrist, hand and finger(s), initial encounter: Secondary | ICD-10-CM

## 2020-12-04 DIAGNOSIS — S62316A Displaced fracture of base of fifth metacarpal bone, right hand, initial encounter for closed fracture: Secondary | ICD-10-CM

## 2020-12-04 MED ORDER — HYDROCODONE-ACETAMINOPHEN 5-325 MG PO TABS: 1.0000 | ORAL_TABLET | Freq: Four times a day (QID) | ORAL | 0 refills | Status: DC | PRN

## 2020-12-04 MED ORDER — HYDROCODONE-ACETAMINOPHEN 5-325 MG PO TABS
1.0000 | ORAL_TABLET | Freq: Once | ORAL | Status: AC
  Administered 2020-12-04: 1 via ORAL

## 2020-12-04 MED ORDER — HYDROCODONE-ACETAMINOPHEN 5-325 MG PO TABS
ORAL_TABLET | ORAL | Status: AC
  Filled 2020-12-04: qty 1

## 2020-12-04 MED ORDER — NAPROXEN 500 MG PO TABS: 500.0000 mg | ORAL_TABLET | Freq: Two times a day (BID) | ORAL | 0 refills | Status: DC

## 2020-12-04 NOTE — Discharge Instructions (Addendum)
Follow-up with Dr. Roney Mans for a consultation regarding your hand fracture.  In the meantime please make sure you keep the splint on. Please schedule naproxen twice daily with food for your severe pain.  If you still have pain despite taking naproxen regularly, this is breakthrough pain.  You can use hydrocodone, a narcotic pain medicine, once every 4-6 hours for this.  Once your pain is better controlled, switch back to just naproxen.

## 2020-12-04 NOTE — ED Notes (Signed)
Notified ortho tech of splint 

## 2020-12-04 NOTE — ED Provider Notes (Signed)
[facility]   MRN: [id number] DOB: 07/19/1991  Subjective:   Gary Owen is a 30 y.o. male presenting for suffering a right hand injury yesterday.  Patient states that he became really upset from losing his job.  He actually ended up punching a door that had a metal piece in it.  He felt immediate pain and has had progressively worsening swelling, difficulty using his hand and wrist.  Has done some icing and limited his hand movement.  Denies loss of sensation, discoloration of the skin, open wounds.  No current facility-administered medications for this encounter.  Current Outpatient Medications:  .  ibuprofen (ADVIL) 800 MG tablet, Take 1 tablet (800 mg total) by mouth 3 (three) times daily., Disp: 21 tablet, Rfl: 0   No Known Allergies  History reviewed. No pertinent past medical history.   History reviewed. No pertinent surgical history.  Family History  Problem Relation Age of Onset  . Healthy Mother   . Healthy Father     Social History   Tobacco Use  . Smoking status: Former Games developer  . Smokeless tobacco: Current User  Substance Use Topics  . Alcohol use: Yes  . Drug use: Yes    Types: Marijuana    ROS   Objective:   Vitals: BP 125/73 (BP Location: Left Arm)   Pulse 68   Temp 98.7 F (37.1 C) (Oral)   Resp 18   SpO2 98%   Physical Exam Constitutional:      General: He is not in acute distress.    Appearance: Normal appearance. He is well-developed and normal weight. He is not ill-appearing, toxic-appearing or diaphoretic.  HENT:     Head: Normocephalic and atraumatic.     Right Ear: External ear normal.     Left Ear: External ear normal.     Nose: Nose normal.     Mouth/Throat:     Pharynx: Oropharynx is clear.  Eyes:     General: No scleral icterus.       Right eye: No discharge.        Left eye: No discharge.     Extraocular Movements: Extraocular movements intact.     Pupils: Pupils are equal, round, and reactive to  light.  Cardiovascular:     Rate and Rhythm: Normal rate.  Pulmonary:     Effort: Pulmonary effort is normal.  Musculoskeletal:       Hands:     Cervical back: Normal range of motion.  Neurological:     Mental Status: He is alert and oriented to person, place, and time.  Psychiatric:        Mood and Affect: Mood normal.        Behavior: Behavior normal.        Thought Content: Thought content normal.        Judgment: Judgment normal.     DG Hand Complete Right  Result Date: 12/04/2020 CLINICAL DATA:  Right hand pain after injury yesterday. EXAM: RIGHT HAND - COMPLETE 3+ VIEW COMPARISON:  July 10, 2017. FINDINGS: Mildly angulated distal fifth metacarpal fracture is noted. Dorsal soft tissue swelling is noted. Joint spaces are intact. IMPRESSION: Mildly angulated distal fifth metacarpal fracture. Electronically Signed   By: Lupita Raider M.D.   On: 12/04/2020 09:56   Assessment and Plan :   I have reviewed the PDMP during this encounter.  1. Displaced fracture of base of fifth metacarpal bone, right hand, initial encounter for closed fracture  2. Right hand pain     Recommended starting naproxen regularly for pain control, use hydrocodone for breakthrough pain.  Follow-up with Dr. Roney Mans for management of right fifth metacarpal fracture.  Wear the splint in the meantime. Counseled patient on potential for adverse effects with medications prescribed/recommended today, ER and return-to-clinic precautions discussed, patient verbalized understanding.    Wallis Bamberg, PA-C 12/04/20 1012

## 2020-12-04 NOTE — ED Triage Notes (Signed)
Pt reports he punched a wall yesterday and feels like he broke his Hand.

## 2020-12-04 NOTE — Progress Notes (Signed)
Orthopedic Tech Progress Note Patient Details:  Armour Villanueva [facility] 09-Aug-1991 [id number]  Ortho Devices Type of Ortho Device: Ulna gutter splint Ortho Device/Splint Location: Right Upper Extremity Ortho Device/Splint Interventions: Ordered,Application   Post Interventions Patient Tolerated: Well Instructions Provided: Care of device,Poper ambulation with device   Tema Alire P Harle Stanford 12/04/2020, 11:44 AM

## 2021-01-16 ENCOUNTER — Ambulatory Visit (HOSPITAL_COMMUNITY)
Admission: EM | Admit: 2021-01-16 | Discharge: 2021-01-16 | Disposition: A | Discharge status: Home / Self Care | Payer: Self-pay | Attending: Family Medicine | Admitting: Family Medicine

## 2021-01-16 ENCOUNTER — Other Ambulatory Visit: Payer: Self-pay

## 2021-01-16 ENCOUNTER — Encounter (HOSPITAL_COMMUNITY): Payer: Self-pay | Admitting: Emergency Medicine

## 2021-01-16 DIAGNOSIS — M5431 Sciatica, right side: Secondary | ICD-10-CM

## 2021-01-16 DIAGNOSIS — M62830 Muscle spasm of back: Secondary | ICD-10-CM

## 2021-01-16 HISTORY — DX: Cardiac murmur, unspecified: R01.1

## 2021-01-16 MED ORDER — PREDNISONE 10 MG (21) PO TBPK: ORAL_TABLET | Freq: Every day | ORAL | 0 refills | Status: AC

## 2021-01-16 MED ORDER — KETOROLAC TROMETHAMINE 30 MG/ML IJ SOLN
30.0000 mg | Freq: Once | INTRAMUSCULAR | Status: DC
Start: 2021-01-16 — End: 2021-01-16

## 2021-01-16 MED ORDER — DEXAMETHASONE SODIUM PHOSPHATE 10 MG/ML IJ SOLN: 10.0000 mg | Freq: Once | INTRAMUSCULAR | Status: DC

## 2021-01-16 MED ORDER — DEXAMETHASONE SODIUM PHOSPHATE 10 MG/ML IJ SOLN
INTRAMUSCULAR | Status: AC
  Filled 2021-01-16: qty 1

## 2021-01-16 MED ORDER — MELOXICAM 15 MG PO TABS
15.0000 mg | ORAL_TABLET | Freq: Every day | ORAL | 0 refills | Status: DC
Start: 2021-01-16 — End: 2022-04-10

## 2021-01-16 MED ORDER — CYCLOBENZAPRINE HCL 10 MG PO TABS: 10.0000 mg | ORAL_TABLET | Freq: Two times a day (BID) | ORAL | 0 refills | Status: DC | PRN

## 2021-01-16 MED ORDER — KETOROLAC TROMETHAMINE 30 MG/ML IJ SOLN
INTRAMUSCULAR | Status: AC
  Filled 2021-01-16: qty 1

## 2021-01-16 NOTE — Discharge Instructions (Addendum)
You have received an injection of toradol for pain in the office today  You have received an injection of a steroid to help with inflammation as well  I have sent in a prednisone taper for you to take for 6 days. 6 tablets on day one, 5 tablets on day two, 4 tablets on day three, 3 tablets on day four, 2 tablets on day five, and 1 tablet on day six.  I have sent in meloxicam for you to take once daily for inflammation. Do not take ibuprofen with this medication. You may take tylenol with this.   I have sent in flexeril for you to take twice a day as needed for muscle spasms. This medication can make you sleepy. Do not drive or operate heavy machinery with this medication.  Follow up with this office or with primary care if symptoms are persisting.  Follow up in the ER for high fever, trouble swallowing, trouble breathing, other concerning symptoms.

## 2021-01-16 NOTE — ED Provider Notes (Signed)
[facility]   [id number] 01/16/21 Arrival Time: 0913  [id number] PAIN  SUBJECTIVE: History from: patient. Gary Owen is a 30 y.o. male complains of right low back pain that began about a week ago. Reports intermittent issues with sciatica. Denies a precipitating event or specific injury. Localizes the pain to the R low back with radiating pain down the R leg. Describes the pain as constant and achy in character with intermittent "shocks" of pain down the R leg. Has tried OTC medications without relief. Symptoms are made worse with activity. Denies fever, chills, erythema, ecchymosis, effusion, weakness, numbness and tingling, saddle paresthesias, loss of bowel or bladder function.      ROS: As per HPI.  All other pertinent ROS negative.     Past Medical History:  Diagnosis Date  . Heart murmur    noted at birth per patient   History reviewed. No pertinent surgical history. No Known Allergies No current facility-administered medications on file prior to encounter.   Current Outpatient Medications on File Prior to Encounter  Medication Sig Dispense Refill  . HYDROcodone-acetaminophen (NORCO/VICODIN) 5-325 MG tablet Take 1 tablet by mouth every 6 (six) hours as needed for severe pain. 10 tablet 0  . ibuprofen (ADVIL) 800 MG tablet Take 1 tablet (800 mg total) by mouth 3 (three) times daily. 21 tablet 0  . naproxen (NAPROSYN) 500 MG tablet Take 1 tablet (500 mg total) by mouth 2 (two) times daily with a meal. 30 tablet 0   Social History   Socioeconomic History  . Marital status: Single    Spouse name: Not on file  . Number of children: Not on file  . Years of education: Not on file  . Highest education level: Not on file  Occupational History  . Not on file  Tobacco Use  . Smoking status: Current Some Day Smoker  . Smokeless tobacco: Never Used  Vaping Use  . Vaping Use: Never used  Substance and Sexual Activity  . Alcohol use: Yes  . Drug use: Yes    Types:  Marijuana  . Sexual activity: Not on file  Other Topics Concern  . Not on file  Social History Narrative  . Not on file   Social Determinants of Health   Financial Resource Strain: Not on file  Food Insecurity: Not on file  Transportation Needs: Not on file  Physical Activity: Not on file  Stress: Not on file  Social Connections: Not on file  Intimate Partner Violence: Not on file   Family History  Problem Relation Age of Onset  . Healthy Mother   . Healthy Father     OBJECTIVE:  Vitals:   01/16/21 0945  BP: (!) 109/58  Pulse: 60  Resp: 20  Temp: 98.2 F (36.8 C)  TempSrc: Oral  SpO2: 97%    General appearance: ALERT; in no acute distress.  Head: NCAT Lungs: Normal respiratory effort CV: pulses 2+ bilaterally. Cap refill < 2 seconds Musculoskeletal:  Inspection: Skin warm, dry, clear and intact No erythema, effusion noted Palpation: R low back tender to palpation ROM: Limited ROM active and passive to back with bending, twisting, changing positions Skin: warm and dry Neurologic: Ambulates without difficulty; Sensation intact about the upper/ lower extremities Psychological: alert and cooperative; normal mood and affect  DIAGNOSTIC STUDIES:  No results found.   ASSESSMENT & PLAN:  1. Sciatica of right side   2. Muscle spasm of back     Meds ordered this encounter  Medications  .  ketorolac (TORADOL) 30 MG/ML injection 30 mg  . dexamethasone (DECADRON) injection 10 mg  . predniSONE (STERAPRED UNI-PAK 21 TAB) 10 MG (21) TBPK tablet    Sig: Take by mouth daily for 6 days. Take 6 tablets on day 1, 5 tablets on day 2, 4 tablets on day 3, 3 tablets on day 4, 2 tablets on day 5, 1 tablet on day 6    Dispense:  21 tablet    Refill:  0    Order Specific Question:   Supervising Provider    Answer:   Merrilee Jansky [id number]  . meloxicam (MOBIC) 15 MG tablet    Sig: Take 1 tablet (15 mg total) by mouth daily.    Dispense:  30 tablet    Refill:  0    Order  Specific Question:   Supervising Provider    Answer:   Merrilee Jansky [id number]  . cyclobenzaprine (FLEXERIL) 10 MG tablet    Sig: Take 1 tablet (10 mg total) by mouth 2 (two) times daily as needed for muscle spasms.    Dispense:  20 tablet    Refill:  0    Order Specific Question:   Supervising Provider    Answer:   Merrilee Jansky [id number]   Decadron 10mg  IM in office today Toradol 30mg  IM in office today Steroid taper prescribed Mobic prescribed Flexeril prescribed Work note provided Continue conservative management of rest, ice, and gentle stretches Take Mobic as prescribed for pain relief (may cause abdominal discomfort, ulcers, and GI bleeds avoid taking with other NSAIDs) Take cyclobenzaprine at nighttime for symptomatic relief. Avoid driving or operating heavy machinery while using medication. Follow up with PCP if symptoms persist Return or go to the ER if you have any new or worsening symptoms (fever, chills, chest pain, abdominal pain, changes in bowel or bladder habits, pain radiating into lower legs)   Reviewed expectations re: course of current medical issues. Questions answered. Outlined signs and symptoms indicating need for more acute intervention. Patient verbalized understanding. After Visit Summary given.       , NP 01/16/21 1048

## 2021-01-16 NOTE — ED Triage Notes (Signed)
Patient has back pain for a week.  Patient has had on-going issues for several years.  Pain is right lower back/buttocks.  Reports radiation into right leg.  Patient walks slumped over and if walking for long periods of time, leg goes numb.

## 2021-01-16 NOTE — ED Notes (Signed)
Pt refused infections.

## 2021-06-21 ENCOUNTER — Other Ambulatory Visit: Payer: Self-pay

## 2021-06-21 ENCOUNTER — Encounter (HOSPITAL_COMMUNITY): Payer: Self-pay

## 2021-06-21 ENCOUNTER — Ambulatory Visit (HOSPITAL_COMMUNITY)
Admission: EM | Admit: 2021-06-21 | Discharge: 2021-06-21 | Disposition: A | Discharge status: Home / Self Care | Payer: Self-pay | Attending: Family Medicine | Admitting: Family Medicine

## 2021-06-21 DIAGNOSIS — S29019A Strain of muscle and tendon of unspecified wall of thorax, initial encounter: Secondary | ICD-10-CM

## 2021-06-21 MED ORDER — PREDNISONE 5 MG PO TABS: ORAL_TABLET | ORAL | 0 refills | Status: DC

## 2021-06-21 NOTE — ED Provider Notes (Signed)
[facility]    CSN: [id number] Arrival date & time: 06/21/21  1815      History   Chief Complaint Chief Complaint  Patient presents with   Back Pain    HPI Gary Owen is a 30 y.o. male.  He is presenting with thoracic back and neck pain.  Has been ongoing for about a week.  Denies any injury.  Does tend to be in flexion with his work.  No radicular pain.  Has not tried any medications.  HPI  Past Medical History:  Diagnosis Date   Heart murmur    noted at birth per patient    Patient Active Problem List   Diagnosis Date Noted   Cellulitis 02/10/2018    History reviewed. No pertinent surgical history.     Home Medications    Prior to Admission medications   Medication Sig Start Date End Date Taking? Authorizing Provider  predniSONE (DELTASONE) 5 MG tablet Take 6 pills for first day, 5 pills second day, 4 pills third day, 3 pills fourth day, 2 pills the fifth day, and 1 pill sixth day. 06/21/21  Yes Myra Rude, MD  cyclobenzaprine (FLEXERIL) 10 MG tablet Take 1 tablet (10 mg total) by mouth 2 (two) times daily as needed for muscle spasms. 01/16/21   Moshe Cipro, NP  HYDROcodone-acetaminophen (NORCO/VICODIN) 5-325 MG tablet Take 1 tablet by mouth every 6 (six) hours as needed for severe pain. 12/04/20   Wallis Bamberg, PA-C  ibuprofen (ADVIL) 800 MG tablet Take 1 tablet (800 mg total) by mouth 3 (three) times daily. 07/05/19   Eustace Moore, MD  meloxicam (MOBIC) 15 MG tablet Take 1 tablet (15 mg total) by mouth daily. 01/16/21   Moshe Cipro, NP  naproxen (NAPROSYN) 500 MG tablet Take 1 tablet (500 mg total) by mouth 2 (two) times daily with a meal. 12/04/20   Wallis Bamberg, PA-C    Family History Family History  Problem Relation Age of Onset   Healthy Mother    Healthy Father     Social History Social History   Tobacco Use   Smoking status: Some Days   Smokeless tobacco: Never  Vaping Use   Vaping Use: Never used  Substance  Use Topics   Alcohol use: Yes   Drug use: Yes    Types: Marijuana     Allergies   Patient has no known allergies.   Review of Systems Review of Systems See HPI  Physical Exam Triage Vital Signs ED Triage Vitals  Enc Vitals Group     BP 06/21/21 1835 (!) 120/94     Pulse Rate 06/21/21 1835 63     Resp 06/21/21 1835 18     Temp 06/21/21 1835 97.8 F (36.6 C)     Temp Source 06/21/21 1835 Oral     SpO2 06/21/21 1835 98 %     Weight --      Height --      Head Circumference --      Peak Flow --      Pain Score 06/21/21 1834 5     Pain Loc --      Pain Edu? --      Excl. in GC? --    No data found.  Updated Vital Signs BP (!) 120/94 (BP Location: Right Arm)   Pulse 63   Temp 97.8 F (36.6 C) (Oral)   Resp 18   SpO2 98%   Visual Acuity Right Eye Distance:  Left Eye Distance:   Bilateral Distance:    Right Eye Near:   Left Eye Near:    Bilateral Near:     Physical Exam Neck: Limited flexion. Normal extension. Normal lateral rotation. Normal strength resistance in upper extremity. Neurovascular intact  UC Treatments / Results  Labs (all labs ordered are listed, but only abnormal results are displayed) Labs Reviewed - No data to display  EKG   Radiology No results found.  Procedures Procedures (including critical care time)  Medications Ordered in UC Medications - No data to display  Initial Impression / Assessment and Plan / UC Course  I have reviewed the triage vital signs and the nursing notes.  Pertinent labs & imaging results that were available during my care of the patient were reviewed by me and considered in my medical decision making (see chart for details).     Gary Owen is a 30 year old male that is presenting with thoracic back pain and neck pain.  Most likely related to spasm and repetitive activity with work.  Counseled on home exercise therapy and supportive care.  Provided prednisone.  Counseled on follow-up.   Final  Clinical Impressions(s) / UC Diagnoses   Final diagnoses:  Thoracic myofascial strain, initial encounter     Discharge Instructions      Please try heat  Please try the exercises  Please follow up if your symptoms fail to improve.      ED Prescriptions     Medication Sig Dispense Auth. Provider   predniSONE (DELTASONE) 5 MG tablet Take 6 pills for first day, 5 pills second day, 4 pills third day, 3 pills fourth day, 2 pills the fifth day, and 1 pill sixth day. 21 tablet Myra Rude, MD      PDMP not reviewed this encounter.   Myra Rude, MD 06/21/21 Jerene Bears

## 2021-06-21 NOTE — Discharge Instructions (Addendum)
Please try heat  Please try the exercises  Please follow up if your symptoms fail to improve.  

## 2021-06-21 NOTE — ED Triage Notes (Signed)
Pt presents stating he has spine pain X 1 week. Pt states he bends down and looks down a lot at work and states he woke up this morning not being able to move his neck like normal.

## 2021-11-21 ENCOUNTER — Other Ambulatory Visit: Payer: Self-pay

## 2021-11-21 ENCOUNTER — Ambulatory Visit (HOSPITAL_COMMUNITY)
Admission: EM | Admit: 2021-11-21 | Discharge: 2021-11-21 | Disposition: A | Discharge status: Home / Self Care | Payer: Self-pay

## 2021-11-21 ENCOUNTER — Encounter (HOSPITAL_COMMUNITY): Payer: Self-pay | Admitting: Emergency Medicine

## 2021-11-21 DIAGNOSIS — S060X0A Concussion without loss of consciousness, initial encounter: Secondary | ICD-10-CM

## 2021-11-21 NOTE — ED Provider Notes (Signed)
[facility]    CSN: [id number] Arrival date & time: 11/21/21  1220      History   Chief Complaint Chief Complaint  Patient presents with   Head Injury    HPI Gary Owen is a 31 y.o. male.   Headache Yesterday around 4pm was playing basketball and another player hit him on the left side of the head Had immediate pain No LOC Continued to play  Has had a headache since that time Some dizziness as well No vomiting No phonophobia, photophobia, difficulty concentrating Denies history of anxiety/depression, ADD/ADHD Reports that he had prior concussions in childhood but was never treated for them Has some soreness on the side of his head where he was hit and thinks that it is a little swollen there No changes in vision  Otherwise in usual state of health   Past Medical History:  Diagnosis Date   Heart murmur    noted at birth per patient    Patient Active Problem List   Diagnosis Date Noted   Cellulitis 02/10/2018    History reviewed. No pertinent surgical history.     Home Medications    Prior to Admission medications   Medication Sig Start Date End Date Taking? Authorizing Provider  cyclobenzaprine (FLEXERIL) 10 MG tablet Take 1 tablet (10 mg total) by mouth 2 (two) times daily as needed for muscle spasms. Patient not taking: Reported on 11/21/2021 01/16/21   Moshe CiproMatthews, Stephanie, NP  HYDROcodone-acetaminophen (NORCO/VICODIN) 5-325 MG tablet Take 1 tablet by mouth every 6 (six) hours as needed for severe pain. Patient not taking: Reported on 11/21/2021 12/04/20   Wallis BambergMani, Mario, PA-C  ibuprofen (ADVIL) 800 MG tablet Take 1 tablet (800 mg total) by mouth 3 (three) times daily. 07/05/19   Eustace MooreNelson, Yvonne Sue, MD  meloxicam (MOBIC) 15 MG tablet Take 1 tablet (15 mg total) by mouth daily. Patient not taking: Reported on 11/21/2021 01/16/21   Moshe CiproMatthews, Stephanie, NP  naproxen (NAPROSYN) 500 MG tablet Take 1 tablet (500 mg total) by mouth 2 (two) times daily with a  meal. Patient not taking: Reported on 11/21/2021 12/04/20   Wallis BambergMani, Mario, PA-C  predniSONE (DELTASONE) 5 MG tablet Take 6 pills for first day, 5 pills second day, 4 pills third day, 3 pills fourth day, 2 pills the fifth day, and 1 pill sixth day. Patient not taking: Reported on 11/21/2021 06/21/21   Myra RudeSchmitz, Jeremy E, MD    Family History Family History  Problem Relation Age of Onset   Healthy Mother    Healthy Father     Social History Social History   Tobacco Use   Smoking status: Some Days   Smokeless tobacco: Never  Vaping Use   Vaping Use: Never used  Substance Use Topics   Alcohol use: Yes   Drug use: Yes    Types: Marijuana     Allergies   Patient has no known allergies.   Review of Systems Review of Systems  All other systems reviewed and are negative.  Per HPI Physical Exam Triage Vital Signs ED Triage Vitals  Enc Vitals Group     BP      Pulse      Resp      Temp      Temp src      SpO2      Weight      Height      Head Circumference      Peak Flow      Pain  Score      Pain Loc      Pain Edu?      Excl. in GC?    No data found.  Updated Vital Signs BP 119/67 (BP Location: Left Arm)    Pulse 62    Temp 97.9 F (36.6 C) (Oral)    Resp 18    SpO2 99%   Visual Acuity Right Eye Distance:   Left Eye Distance:   Bilateral Distance:    Right Eye Near:   Left Eye Near:    Bilateral Near:     Physical Exam Constitutional:      General: He is not in acute distress.    Appearance: Normal appearance. He is not ill-appearing.  HENT:     Head:     Comments: Small area of swelling over left temple with slight ecchymosis and some TTP in this region, no other cranial TTP Eyes:     Extraocular Movements: Extraocular movements intact.     Conjunctiva/sclera: Conjunctivae normal.     Pupils: Pupils are equal, round, and reactive to light.  Cardiovascular:     Rate and Rhythm: Normal rate.  Pulmonary:     Effort: Pulmonary effort is normal. No  respiratory distress.  Musculoskeletal:     Cervical back: Normal range of motion and neck supple. No tenderness.     Comments: 5/5 strength BUE/BLE  Skin:    General: Skin is warm and dry.  Neurological:     General: No focal deficit present.     Mental Status: He is alert and oriented to person, place, and time.     Cranial Nerves: No cranial nerve deficit.     Sensory: No sensory deficit.     Gait: Gait normal.     Comments: Intact double leg stance Intact finger to nose 2 errors with single leg stance 1 error with tandem stance  Psychiatric:        Mood and Affect: Mood normal.        Behavior: Behavior normal.     UC Treatments / Results  Labs (all labs ordered are listed, but only abnormal results are displayed) Labs Reviewed - No data to display  EKG   Radiology No results found.  Procedures Procedures (including critical care time)  Medications Ordered in UC Medications - No data to display  Initial Impression / Assessment and Plan / UC Course  I have reviewed the triage vital signs and the nursing notes.  Pertinent labs & imaging results that were available during my care of the patient were reviewed by me and considered in my medical decision making (see chart for details).     No indication for head imaging at this time.  Patient neurologically intact on examination.  He does have signs and symptoms consistent with a concussion.  Discussed typical postconcussive course.  We will write him off of work for today, however he would like to go back tomorrow.  Discussed typically that symptoms in adults can last up to 4 weeks.  Recommended follow-up with sports medicine or primary care provider if not improving over the next few weeks or if he has concerns with returning to work.  Recommended staying well-hydrated, eating a good healthy diet, avoiding excessive caffeine.  Given that he is rather active with sports, discussed a generalized return to play protocol for  him to follow-up before he goes back fully to sport.  Given ED precautions, see AVS.   Final Clinical Impressions(s) / UC Diagnoses  Final diagnoses:  Concussion without loss of consciousness, initial encounter     Discharge Instructions      As we discussed, you have signs and symptoms of a concussion.  In adults these can last up to 4 weeks usually.  If you have severe worsening of your pain, you are vomiting repetitively, you have weakness on one side of your body or the other, you should be seen at the emergency room right away.  As we discussed, there is no treatment for a concussion.  Make sure that you stay well-hydrated and try to get at least 30 minutes of light walking in a day as this has been shown to speed up your recovery time.  Avoid screens as much as possible.  Do not drink excessive amounts of caffeine.  If you have concerns or having difficulty at work, you can follow-up with your primary care provider or the sports medicine office.  In regards to getting back to playing basketball, as we discussed, once you are completely free of symptoms you can start gradually increasing your activity until you are ready to go back to basketball.  I recommend starting with 20 to 30 minutes of light jogging and then progressively increasing this activity over the next few days.  You can play noncontact basketball after a few days if you continue without symptoms.  If this goes well, it is okay to progress to getting back to your normal basketball routine.  If at any point your symptoms return, you should stop the activity and then try again the next day.     ED Prescriptions   None    PDMP not reviewed this encounter.   Unknown Jim, DO 11/21/21 1335

## 2021-11-21 NOTE — ED Triage Notes (Addendum)
Patient was playing basketball yesterday and his head collided with another player.  Pain on left side of head is tender to touch.  No loc.  Patient also reports inability to open mouth wide due to pain.  Reports clicking to left side of face with movement of mouth.  Reports yawning causes pain

## 2021-11-21 NOTE — Discharge Instructions (Signed)
As we discussed, you have signs and symptoms of a concussion.  In adults these can last up to 4 weeks usually.  If you have severe worsening of your pain, you are vomiting repetitively, you have weakness on one side of your body or the other, you should be seen at the emergency room right away.  As we discussed, there is no treatment for a concussion.  Make sure that you stay well-hydrated and try to get at least 30 minutes of light walking in a day as this has been shown to speed up your recovery time.  Avoid screens as much as possible.  Do not drink excessive amounts of caffeine.  If you have concerns or having difficulty at work, you can follow-up with your primary care provider or the sports medicine office.  In regards to getting back to playing basketball, as we discussed, once you are completely free of symptoms you can start gradually increasing your activity until you are ready to go back to basketball.  I recommend starting with 20 to 30 minutes of light jogging and then progressively increasing this activity over the next few days.  You can play noncontact basketball after a few days if you continue without symptoms.  If this goes well, it is okay to progress to getting back to your normal basketball routine.  If at any point your symptoms return, you should stop the activity and then try again the next day.

## 2022-04-10 ENCOUNTER — Ambulatory Visit (INDEPENDENT_AMBULATORY_CARE_PROVIDER_SITE_OTHER): Payer: Self-pay

## 2022-04-10 ENCOUNTER — Encounter (HOSPITAL_COMMUNITY): Payer: Self-pay | Admitting: Emergency Medicine

## 2022-04-10 ENCOUNTER — Ambulatory Visit (HOSPITAL_COMMUNITY)
Admission: EM | Admit: 2022-04-10 | Discharge: 2022-04-10 | Disposition: A | Discharge status: Home / Self Care | Payer: Self-pay | Attending: Physician Assistant | Admitting: Physician Assistant

## 2022-04-10 DIAGNOSIS — M542 Cervicalgia: Secondary | ICD-10-CM

## 2022-04-10 DIAGNOSIS — M62838 Other muscle spasm: Secondary | ICD-10-CM

## 2022-04-10 MED ORDER — METHOCARBAMOL 500 MG PO TABS: 500.0000 mg | ORAL_TABLET | Freq: Two times a day (BID) | ORAL | 0 refills | Status: DC

## 2022-04-10 MED ORDER — NAPROXEN 500 MG PO TABS: 500.0000 mg | ORAL_TABLET | Freq: Two times a day (BID) | ORAL | 0 refills | Status: DC
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
## 2022-04-10 NOTE — Discharge Instructions (Signed)
Your x-ray showed some arthritis/degenerative changes but was otherwise normal.  Start Naprosyn twice daily.  Do not take NSAIDs with this medication including aspirin, ibuprofen/Advil, naproxen/Aleve.  Robaxin up to twice a day.  This can make you sleepy so do not drive or drink alcohol while taking it.  Use heat, rest, stretch for additional symptom relief.  If your symptoms are not improving please follow-up with orthopedics; call to schedule an appointment.  If anything worsens and you have severe pain, difficulty moving your neck, numbness or tingling in your arms you need to be seen immediately.

## 2022-04-10 NOTE — ED Triage Notes (Signed)
Patient c/o RT sided neck pain x 1 month.   Patient denies fall or trauma to neck.   Patient endorses worsening pain.   Patient states " I look down a lot at work and I'm not sure if that has something to do with it".   Patient endorses pain with movement. Patient has difficulty moving neck to RT side.    Patient tried ibuprofen and naproxen with no relief of symptoms.

## 2022-09-29 ENCOUNTER — Encounter (HOSPITAL_COMMUNITY): Payer: Self-pay | Admitting: *Deleted

## 2022-09-29 ENCOUNTER — Ambulatory Visit (HOSPITAL_COMMUNITY): Payer: Self-pay

## 2022-09-29 ENCOUNTER — Ambulatory Visit (HOSPITAL_COMMUNITY)
Admission: EM | Admit: 2022-09-29 | Discharge: 2022-09-29 | Disposition: A | Discharge status: Home / Self Care | Payer: Self-pay | Attending: Family Medicine | Admitting: Family Medicine

## 2022-09-29 ENCOUNTER — Other Ambulatory Visit: Payer: Self-pay

## 2022-09-29 ENCOUNTER — Ambulatory Visit (INDEPENDENT_AMBULATORY_CARE_PROVIDER_SITE_OTHER): Payer: Self-pay

## 2022-09-29 DIAGNOSIS — M79641 Pain in right hand: Secondary | ICD-10-CM

## 2022-09-29 DIAGNOSIS — M79642 Pain in left hand: Secondary | ICD-10-CM

## 2022-09-29 MED ORDER — NAPROXEN 500 MG PO TABS: 500.0000 mg | ORAL_TABLET | Freq: Two times a day (BID) | ORAL | 0 refills | Status: DC

## 2022-09-29 NOTE — ED Triage Notes (Signed)
Pt reports he noticed his hands were swollen around 11PM last night.

## 2022-09-29 NOTE — Discharge Instructions (Signed)
You were seen today for bilateral hand pain.  The xrays were negative for fracture or dislocation.  This may be due for over use injury.  At this time I recommend an anti-inflammatory taken twice/day.  I have sent this to your pharmacy.  You may use ice on the hands to help as well.  Please follow up with your primary care provider if you are not improving over the next several days.
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
## 2024-06-09 ENCOUNTER — Telehealth: Payer: Self-pay | Admitting: Physician Assistant

## 2024-06-09 DIAGNOSIS — K529 Noninfective gastroenteritis and colitis, unspecified: Secondary | ICD-10-CM

## 2024-06-09 MED ORDER — FAMOTIDINE 20 MG PO TABS: 20.0000 mg | ORAL_TABLET | Freq: Two times a day (BID) | ORAL | 0 refills | Status: AC

## 2024-06-09 MED ORDER — ONDANSETRON 4 MG PO TBDP: 4.0000 mg | ORAL_TABLET | Freq: Three times a day (TID) | ORAL | 0 refills | Status: AC | PRN

## 2024-06-09 NOTE — Progress Notes (Signed)
 Virtual Visit Consent   Gary Owen, you are scheduled for a virtual visit with a [facility] provider today. Just as with appointments in the office, your consent must be obtained to participate. Your consent will be active for this visit and any virtual visit you may have with one of our providers in the next 365 days. If you have a MyChart account, a copy of this consent can be sent to you electronically.  As this is a virtual visit, video technology does not allow for your provider to perform a traditional examination. This may limit your provider's ability to fully assess your condition. If your provider identifies any concerns that need to be evaluated in person or the need to arrange testing (such as labs, EKG, etc.), we will make arrangements to do so. Although advances in technology are sophisticated, we cannot ensure that it will always work on either your end or our end. If the connection with a video visit is poor, the visit may have to be switched to a telephone visit. With either a video or telephone visit, we are not always able to ensure that we have a secure connection.  By engaging in this virtual visit, you consent to the provision of healthcare and authorize for your insurance to be billed (if applicable) for the services provided during this visit. Depending on your insurance coverage, you may receive a charge related to this service.  I need to obtain your verbal consent now. Are you willing to proceed with your visit today? Gary Owen has provided verbal consent on 06/09/2024 for a virtual visit (video or telephone). Gary Owen, [place]  Date: 06/09/2024 8:45 AM   Virtual Visit via Video Note   I, Gary Owen, connected with  Gary Owen  ([id number], [birth year]) on 06/09/24 at  8:30 AM EDT by a video-enabled telemedicine application and verified that I am speaking with the correct person using two identifiers.  Location: Patient: Virtual Visit Location  Patient: Home Provider: Virtual Visit Location Provider: Home Office   I discussed the limitations of evaluation and management by telemedicine and the availability of in person appointments. The patient expressed understanding and agreed to proceed.    History of Present Illness: Gary Owen is a 33 y.o. who identifies as a male who was assigned male at birth, and is being seen today for upper stomach pain L sided and bilateral lower abdominal pain starting last night, initially associated with frequent loose stools. These have clamed down, but still with pain (6/10) at present, nausea (no emesis) and anorexia. Denies fever, chills. Denies recent travel or sick contact. Had some food yesterday he thinks may not have agreed with him. Denies melena, hematochezia or tenesmus. Denies heartburn or indigestion.   OTC -- Ibuprofen    HPI: HPI  Problems:  Patient Active Problem List   Diagnosis Date Noted   Cellulitis 02/10/2018    Allergies: No Known Allergies Medications:  Current Outpatient Medications:    famotidine  (PEPCID ) 20 MG tablet, Take 1 tablet (20 mg total) by mouth 2 (two) times daily., Disp: 30 tablet, Rfl: 0   ondansetron  (ZOFRAN -ODT) 4 MG disintegrating tablet, Take 1 tablet (4 mg total) by mouth every 8 (eight) hours as needed for nausea or vomiting., Disp: 20 tablet, Rfl: 0  Observations/Objective: Patient is well-developed, well-nourished in no acute distress.  Resting comfortably  at home.  Head is normocephalic, atraumatic.  No labored breathing.  Speech is clear and coherent with logical  content.  Patient is alert and oriented at baseline.   Assessment and Plan: 1. Gastroenteritis (Primary) - ondansetron  (ZOFRAN -ODT) 4 MG disintegrating tablet; Take 1 tablet (4 mg total) by mouth every 8 (eight) hours as needed for nausea or vomiting.  Dispense: 20 tablet; Refill: 0 - famotidine  (PEPCID ) 20 MG tablet; Take 1 tablet (20 mg total) by mouth 2 (two) times daily.   Dispense: 30 tablet; Refill: 0  No alarm signs or symptoms present. Supportive measures and OTC medications reviewed. Zofran  and Famotidine  per orders. Ok to use OTC Pepto. Avoid NSAIDs. Strict ER precautions reviewed. Work note provided.   Follow Up Instructions: I discussed the assessment and treatment plan with the patient. The patient was provided an opportunity to ask questions and all were answered. The patient agreed with the plan and demonstrated an understanding of the instructions.  A copy of instructions were sent to the patient via MyChart unless otherwise noted below.   The patient was advised to call back or seek an in-person evaluation if the symptoms worsen or if the condition fails to improve as anticipated.    Gary Velma Lunger, PA-C

## 2024-06-09 NOTE — Patient Instructions (Signed)
 Gary Owen, thank you for joining [name], PA-C for today's virtual visit.  While this provider is not your primary care provider (PCP), if your PCP is located in our provider database this encounter information will be shared with them immediately following your visit.   A [facility] MyChart account gives you access to today's visit and all your visits, tests, and labs performed at [facility]  click here if you don't have a [facility] MyChart account or go to [email or web address]  Consent: (Patient) Gary Owen provided verbal consent for this virtual visit at the beginning of the encounter.  Current Medications:  Current Outpatient Medications:    famotidine  (PEPCID ) 20 MG tablet, Take 1 tablet (20 mg total) by mouth 2 (two) times daily., Disp: 30 tablet, Rfl: 0   ondansetron  (ZOFRAN -ODT) 4 MG disintegrating tablet, Take 1 tablet (4 mg total) by mouth every 8 (eight) hours as needed for nausea or vomiting., Disp: 20 tablet, Rfl: 0   Medications ordered in this encounter:  Meds ordered this encounter  Medications   ondansetron  (ZOFRAN -ODT) 4 MG disintegrating tablet    Sig: Take 1 tablet (4 mg total) by mouth every 8 (eight) hours as needed for nausea or vomiting.    Dispense:  20 tablet    Refill:  0    Supervising Provider:   LAMPTEY, PHILIP O [[id number]]   famotidine  (PEPCID ) 20 MG tablet    Sig: Take 1 tablet (20 mg total) by mouth 2 (two) times daily.    Dispense:  30 tablet    Refill:  0    Supervising Provider:   LAMPTEY, PHILIP O [[id number]]     *If you need refills on other medications prior to your next appointment, please contact your pharmacy*  Follow-Up: Call back or seek an in-person evaluation if the symptoms worsen or if the condition fails to improve as anticipated.  [facility] [phone]  Other Instructions Please hydrate and rest. Tylenol  if needed for pain. Start OTC Pepto Bismol. Take the prescribed  medications as directed. Once appetite returns, follow dietary recommendations below. If you note any non-resolving, new, or worsening symptoms despite treatment, please seek an in-person evaluation ASAP.   Bland Diet A bland diet may consist of soft foods or foods that are not high in fat or are not greasy, acidic, or spicy. Avoiding certain foods may cause less irritation to your mouth, throat, stomach, or gastrointestinal tract. Avoiding certain foods may make you feel better. Everyone's tolerances are different. A bland diet should be based on what you can tolerate and what may cause discomfort. What is my plan? Your health care provider or dietitian may recommend specific changes to your diet to treat your symptoms. These changes may include: Eating small meals frequently. Cooking food until it is soft enough to chew easily. Taking the time to chew your food thoroughly, so it is easy to swallow and digest. Avoiding foods that cause you discomfort. These may include spicy food, fried food, greasy foods, hard-to-chew foods, or citrus fruits and juices. Drinking slowly. What are tips for following this plan? Reading food labels To reduce fiber intake, look for food labels that say whole, such as whole wheat or whole grain. Shopping Avoid food items that may have nuts or seeds. Avoid vegetables that may make you gassy or have a tough texture, such as broccoli, cauliflower, or corn. Cooking Cook foods thoroughly so they have a soft texture. Meal planning Make sure you  include foods from all food groups to eat a balanced diet. Eat a variety of types of foods. Eat foods and drink beverages that do not cause you discomfort. These may include soups and broths with cooked meats, pasta, and vegetables. Lifestyle Sit up after meals, avoid tight clothing, and take time to eat and chew your food slowly. Ask your health care provider whether you should take dietary supplements. General  information Mildly season your foods. Some seasonings, such as cayenne pepper, vinegar, or hot sauce, may cause irritation. The foods, beverages, or seasonings to avoid should be based on individual tolerance. What foods should I eat? Fruits Canned or cooked fruit such as peaches, pears, or applesauce. Bananas. Vegetables Well-cooked vegetables. Canned or cooked vegetables such as carrots, green beans, beets, or spinach. Mashed or boiled potatoes. Grains  Hot cereals, such as cream of wheat and processed oatmeal. Rice. Bread, crackers, pasta, or tortillas made from refined white flour. Meats and other proteins  Eggs. Creamy peanut butter or other nut butters. Lean, well-cooked tender meats, such as beef, pork, chicken, or fish. Dairy Low-fat dairy products such as milk, cottage cheese, or yogurt. Beverages  Water. Herbal tea. Apple juice. Fats and oils Mild salad dressings. Canola or olive oil. Sweets and desserts Low-fat pudding, custard, or ice cream. Fruit gelatin. The items listed above may not be a complete list of foods and beverages you can eat. Contact a dietitian for more information. What foods should I avoid? Fruits Citrus fruits, such as oranges and grapefruit. Fruits with a stringy texture. Fruits that have lots of seeds, such as kiwi or strawberries. Dried fruits. Vegetables Raw, uncooked vegetables. Salads. Grains Whole grain breads, muffins, and cereals. Meats and other proteins Tough, fibrous meats. Highly seasoned meat such as corned beef, smoked meats, or fish. Processed high-fat meats such as brats, hot dogs, or sausage. Dairy Full-fat dairy foods such as ice cream and cheese. Beverages Caffeinated drinks. Alcohol. Seasonings and condiments Strongly flavored seasonings or condiments. Hot sauce. Salsa. Other foods Spicy foods. Fried or greasy foods. Sour foods, such as pickled or fermented foods like sauerkraut. Foods high in fiber. The items listed above  may not be a complete list of foods and beverages you should avoid. Contact a dietitian for more information. Summary A bland diet should be based on individual tolerance. It may consist of foods that are soft textured and do not have a lot of fat, fiber, acid, or seasonings. A bland diet may be recommended because avoiding certain foods, beverages, or spices may make you feel better. This information is not intended to replace advice given to you by your health care provider. Make sure you discuss any questions you have with your health care provider. Document Revised: 08/27/2021 Document Reviewed: 08/27/2021 Elsevier Patient Education  2024 [employer]   If you have been instructed to have an in-person evaluation today at a local Urgent Care facility, please use the link below. It will take you to a list of all of our available [facility], including address, phone number and hours of operation. Please do not delay care.  [facility]  If you or a family member do not have a primary care provider, use the link below to schedule a visit and establish care. When you choose a [facility] primary care physician or advanced practice provider, you gain a long-term partner in health. Find a Primary Care Provider  Learn more about 's in-office and virtual care options:  [facility] - Get Care Now
